# Patient Record
Sex: Female | Born: 1995 | Race: Black or African American | Hispanic: No | Marital: Single | Smoking: Former smoker
Health system: Southern US, Community
[De-identification: ages and names within clinical notes are randomized; demographics above are authoritative.]

## PROBLEM LIST (undated history)

## (undated) ENCOUNTER — Inpatient Hospital Stay: Payer: Self-pay

## (undated) DIAGNOSIS — O99019 Anemia complicating pregnancy, unspecified trimester: Secondary | ICD-10-CM

## (undated) DIAGNOSIS — K8020 Calculus of gallbladder without cholecystitis without obstruction: Secondary | ICD-10-CM

## (undated) DIAGNOSIS — Z8669 Personal history of other diseases of the nervous system and sense organs: Secondary | ICD-10-CM

## (undated) DIAGNOSIS — F99 Mental disorder, not otherwise specified: Secondary | ICD-10-CM

## (undated) DIAGNOSIS — F1210 Cannabis abuse, uncomplicated: Secondary | ICD-10-CM

## (undated) HISTORY — DX: Personal history of other diseases of the nervous system and sense organs: Z86.69

## (undated) HISTORY — DX: Mental disorder, not otherwise specified: F99

## (undated) HISTORY — PX: CHOLECYSTECTOMY: SHX55

---
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
## 2014-01-24 ENCOUNTER — Emergency Department: Payer: Self-pay | Admitting: Internal Medicine

## 2014-01-24 LAB — URINALYSIS, COMPLETE
BILIRUBIN, UR: NEGATIVE
Bacteria: NONE SEEN
Blood: NEGATIVE
Glucose,UR: NEGATIVE mg/dL (ref 0–75)
Leukocyte Esterase: NEGATIVE
Nitrite: NEGATIVE
PH: 5 (ref 4.5–8.0)
Protein: NEGATIVE
RBC, UR: NONE SEEN /HPF (ref 0–5)
Specific Gravity: 1.03 (ref 1.003–1.030)
WBC UR: NONE SEEN /HPF (ref 0–5)

## 2014-01-24 LAB — CBC
HCT: 37.4 % (ref 35.0–47.0)
HGB: 12.2 g/dL (ref 12.0–16.0)
MCH: 29.3 pg (ref 26.0–34.0)
MCHC: 32.7 g/dL (ref 32.0–36.0)
MCV: 90 fL (ref 80–100)
Platelet: 249 10*3/uL (ref 150–440)
RBC: 4.17 10*6/uL (ref 3.80–5.20)
RDW: 13.9 % (ref 11.5–14.5)
WBC: 5.9 10*3/uL (ref 3.6–11.0)

## 2014-01-24 LAB — HCG, QUANTITATIVE, PREGNANCY: Beta Hcg, Quant.: 9979 m[IU]/mL — ABNORMAL HIGH

## 2014-05-02 ENCOUNTER — Emergency Department: Payer: Self-pay | Admitting: Emergency Medicine

## 2014-07-15 ENCOUNTER — Emergency Department: Payer: Self-pay | Admitting: Emergency Medicine

## 2014-07-15 LAB — CBC WITH DIFFERENTIAL/PLATELET
Bands: 1 %
EOS PCT: 2 %
HCT: 29.6 % — AB (ref 35.0–47.0)
HGB: 9.3 g/dL — ABNORMAL LOW (ref 12.0–16.0)
LYMPHS PCT: 22 %
MCH: 29.1 pg (ref 26.0–34.0)
MCHC: 31.5 g/dL — AB (ref 32.0–36.0)
MCV: 92 fL (ref 80–100)
Metamyelocyte: 4 %
Monocytes: 9 %
Platelet: 169 10*3/uL (ref 150–440)
RBC: 3.2 10*6/uL — AB (ref 3.80–5.20)
RDW: 15 % — AB (ref 11.5–14.5)
SEGMENTED NEUTROPHILS: 62 %
WBC: 7.7 10*3/uL (ref 3.6–11.0)

## 2014-07-15 LAB — COMPREHENSIVE METABOLIC PANEL
ALBUMIN: 2.8 g/dL — AB (ref 3.8–5.6)
ALK PHOS: 108 U/L
ALT: 20 U/L
ANION GAP: 9 (ref 7–16)
AST: 18 U/L (ref 0–26)
BUN: 7 mg/dL (ref 7–18)
Bilirubin,Total: 0.3 mg/dL (ref 0.2–1.0)
CALCIUM: 8.1 mg/dL — AB (ref 9.0–10.7)
CO2: 24 mmol/L (ref 21–32)
CREATININE: 0.45 mg/dL — AB (ref 0.60–1.30)
Chloride: 107 mmol/L (ref 98–107)
EGFR (African American): >60
EGFR (Non-African Amer.): >60
GLUCOSE: 78 mg/dL (ref 65–99)
OSMOLALITY: 276 (ref 275–301)
POTASSIUM: 3.5 mmol/L (ref 3.5–5.1)
Sodium: 140 mmol/L (ref 136–145)
TOTAL PROTEIN: 6.3 g/dL — AB (ref 6.4–8.6)

## 2014-07-15 LAB — URINALYSIS, COMPLETE
BACTERIA: NONE SEEN
BILIRUBIN, UR: NEGATIVE
BLOOD: NEGATIVE
GLUCOSE, UR: NEGATIVE mg/dL (ref 0–75)
Ketone: NEGATIVE
Leukocyte Esterase: NEGATIVE
NITRITE: NEGATIVE
PH: 6 (ref 4.5–8.0)
PROTEIN: NEGATIVE
SPECIFIC GRAVITY: 1.011 (ref 1.003–1.030)
Squamous Epithelial: 9
WBC UR: 7 /HPF (ref 0–5)

## 2014-09-17 ENCOUNTER — Inpatient Hospital Stay: Payer: Self-pay | Admitting: Obstetrics & Gynecology

## 2014-09-17 LAB — PIH PROFILE
ANION GAP: 4 — AB (ref 7–16)
BUN: 5 mg/dL — AB (ref 7–18)
CALCIUM: 8.6 mg/dL — AB (ref 9.0–10.7)
CHLORIDE: 106 mmol/L (ref 98–107)
CO2: 28 mmol/L (ref 21–32)
Creatinine: 0.53 mg/dL — ABNORMAL LOW (ref 0.60–1.30)
GLUCOSE: 97 mg/dL (ref 65–99)
HCT: 32.3 % — AB (ref 35.0–47.0)
HGB: 10.2 g/dL — AB (ref 12.0–16.0)
MCH: 28.6 pg (ref 26.0–34.0)
MCHC: 31.7 g/dL — ABNORMAL LOW (ref 32.0–36.0)
MCV: 90 fL (ref 80–100)
OSMOLALITY: 273 (ref 275–301)
Platelet: 154 10*3/uL (ref 150–440)
Potassium: 3.8 mmol/L (ref 3.5–5.1)
RBC: 3.58 10*6/uL — AB (ref 3.80–5.20)
RDW: 16.7 % — AB (ref 11.5–14.5)
SGOT(AST): 18 U/L (ref 0–26)
SODIUM: 138 mmol/L (ref 136–145)
Uric Acid: 3.5 mg/dL (ref 3.0–5.8)
WBC: 6.7 10*3/uL (ref 3.6–11.0)

## 2014-09-17 LAB — DRUG SCREEN, URINE
Amphetamines, Ur Screen: NEGATIVE (ref ?–1000)
Barbiturates, Ur Screen: NEGATIVE (ref ?–200)
Benzodiazepine, Ur Scrn: NEGATIVE (ref ?–200)
CANNABINOID 50 NG, UR ~~LOC~~: NEGATIVE (ref ?–50)
COCAINE METABOLITE, UR ~~LOC~~: NEGATIVE (ref ?–300)
MDMA (Ecstasy)Ur Screen: NEGATIVE (ref ?–500)
METHADONE, UR SCREEN: NEGATIVE (ref ?–300)
OPIATE, UR SCREEN: NEGATIVE (ref ?–300)
PHENCYCLIDINE (PCP) UR S: NEGATIVE (ref ?–25)
TRICYCLIC, UR SCREEN: NEGATIVE (ref ?–1000)

## 2014-09-17 LAB — CBC WITH DIFFERENTIAL/PLATELET
Eosinophil: 1 %
HCT: 31.9 % — ABNORMAL LOW (ref 35.0–47.0)
HGB: 10.3 g/dL — AB (ref 12.0–16.0)
LYMPHS PCT: 28 %
MCH: 28.7 pg (ref 26.0–34.0)
MCHC: 32.2 g/dL (ref 32.0–36.0)
MCV: 89 fL (ref 80–100)
MONOS PCT: 7 %
MYELOCYTE: 2 %
NRBC/100 WBC: 1 /
PLATELETS: 169 10*3/uL (ref 150–440)
RBC: 3.57 10*6/uL — ABNORMAL LOW (ref 3.80–5.20)
RDW: 17.1 % — AB (ref 11.5–14.5)
SEGMENTED NEUTROPHILS: 61 %
VARIANT LYMPHOCYTE - H1-RLYMPH: 1 %
WBC: 7.3 10*3/uL (ref 3.6–11.0)

## 2014-09-17 LAB — PROTEIN / CREATININE RATIO, URINE
Creatinine, Urine: 41.4 mg/dL (ref 30.0–125.0)
PROTEIN, RANDOM URINE: 11 mg/dL (ref 0–12)
Protein/Creat. Ratio: 266 mg/gCREAT — ABNORMAL HIGH (ref 0–200)

## 2014-09-18 LAB — GC/CHLAMYDIA PROBE AMP

## 2014-09-19 LAB — HEMATOCRIT: HCT: 25.3 % — AB (ref 35.0–47.0)

## 2014-10-12 ENCOUNTER — Emergency Department: Payer: Self-pay | Admitting: Emergency Medicine

## 2014-10-12 LAB — COMPREHENSIVE METABOLIC PANEL
ALK PHOS: 162 U/L — AB
ANION GAP: 4 — AB (ref 7–16)
Albumin: 4 g/dL (ref 3.8–5.6)
BILIRUBIN TOTAL: 0.4 mg/dL (ref 0.2–1.0)
BUN: 11 mg/dL (ref 7–18)
CALCIUM: 9.1 mg/dL (ref 9.0–10.7)
CHLORIDE: 106 mmol/L (ref 98–107)
CO2: 29 mmol/L (ref 21–32)
Creatinine: 0.66 mg/dL (ref 0.60–1.30)
Glucose: 93 mg/dL (ref 65–99)
OSMOLALITY: 277 (ref 275–301)
Potassium: 4.2 mmol/L (ref 3.5–5.1)
SGOT(AST): 156 U/L — ABNORMAL HIGH (ref 0–26)
SGPT (ALT): 103 U/L — ABNORMAL HIGH
SODIUM: 139 mmol/L (ref 136–145)
Total Protein: 7.3 g/dL (ref 6.4–8.6)

## 2014-10-12 LAB — URINALYSIS, COMPLETE
BILIRUBIN, UR: NEGATIVE
Bacteria: NONE SEEN
Blood: NEGATIVE
Glucose,UR: NEGATIVE mg/dL (ref 0–75)
KETONE: NEGATIVE
Leukocyte Esterase: NEGATIVE
Nitrite: NEGATIVE
PROTEIN: NEGATIVE
Ph: 8 (ref 4.5–8.0)
SPECIFIC GRAVITY: 1.015 (ref 1.003–1.030)

## 2014-10-12 LAB — CBC WITH DIFFERENTIAL/PLATELET
BASOS ABS: 0 10*3/uL (ref 0.0–0.1)
Basophil %: 0.1 %
EOS ABS: 0 10*3/uL (ref 0.0–0.7)
Eosinophil %: 0.5 %
HCT: 37.1 % (ref 35.0–47.0)
HGB: 11.7 g/dL — ABNORMAL LOW (ref 12.0–16.0)
LYMPHS PCT: 11.5 %
Lymphocyte #: 1 10*3/uL (ref 1.0–3.6)
MCH: 27.4 pg (ref 26.0–34.0)
MCHC: 31.4 g/dL — AB (ref 32.0–36.0)
MCV: 87 fL (ref 80–100)
MONOS PCT: 5 %
Monocyte #: 0.4 x10 3/mm (ref 0.2–0.9)
NEUTROS ABS: 7 10*3/uL — AB (ref 1.4–6.5)
NEUTROS PCT: 82.9 %
PLATELETS: 244 10*3/uL (ref 150–440)
RBC: 4.26 10*6/uL (ref 3.80–5.20)
RDW: 16.4 % — AB (ref 11.5–14.5)
WBC: 8.5 10*3/uL (ref 3.6–11.0)

## 2014-10-12 LAB — LIPASE, BLOOD: LIPASE: 163 U/L (ref 73–393)

## 2014-10-29 HISTORY — PX: CHOLECYSTECTOMY, LAPAROSCOPIC: SHX56

## 2014-11-07 ENCOUNTER — Ambulatory Visit: Payer: Self-pay | Admitting: Surgery

## 2014-11-14 ENCOUNTER — Ambulatory Visit: Payer: Self-pay | Admitting: Surgery

## 2015-01-21 LAB — SURGICAL PATHOLOGY

## 2015-01-27 NOTE — Op Note (Signed)
PATIENT NAMMarland [name]:  Rosemary HolmsGRAVES, [name] MR#:  [id number] DATE OF BIRTH:  07/23/1996  DATE OF PROCEDURE:  11/14/2014  PREOPERATIVE DIAGNOSIS: Symptomatic cholelithiasis.   POSTOPERATIVE DIAGNOSIS: Symptomatic cholelithiasis.   PROCEDURE: Laparoscopic cholecystectomy with cholangiography.   SURGEON:  Dionne Miloichard Adina Puzzo, M.D.   ANESTHESIA: General with endotracheal tube.   INDICATIONS: This is a patient with a history of gallstones and recurrent episodic biliary colic attacks who also had some liver function tests elevation in the immediate postpartum period. She is here for elective laparoscopic cholecystectomy with cholangiography. Her LFTs have returned to normal.   Preoperatively, we discussed rationale for surgery, the options of observation, risk of bleeding, infection, recurrence of symptoms, failure to resolve her symptoms, retained common bile duct stone, open procedure, bile duct damage, bile duct leak, any of which could require further surgery and/or ERCP, stent, and papillotomy. This was reviewed for her in the preop holding area. She understood and agreed to proceed.   FINDINGS: Small gallstones, what appeared to be a dilated common bile duct with a very small cystic duct.   Cholangiography demonstrated good flow in the duodenum. No intraluminal filling defects. Proximal ducts were well identified. The cystic duct had been cannulated.   DESCRIPTION OF PROCEDURE: The patient was induced to general anesthesia, given IV antibiotics. VTE prophylaxis was in place. She was prepped and draped in a sterile fashion. Marcaine was infiltrated in skin and subcutaneous tissues around the infraumbilical area. An incision was made avoiding the area of her supraumbilical piercing. A Veress needle was placed. Pneumoperitoneum was obtained. A 5 mm trocar port was placed. The abdominal cavity was explored, and under direct vision, a 10 mm epigastric port and two lateral 5 mm ports were placed. The gallbladder was  placed on tension. The peritoneum over the infundibulum was incised bluntly. The dilated common bile duct could be easily visualized in this area; therefore, cholangiography was performed as follows:  The cystic duct was dissected out at the infundibular junction was clipped and incised, and through a separate incision, an Angiocath cholangiogram catheter was placed. C-arm fluoroscopic cholangiography demonstrated the above. The cholangiogram catheter was removed. The cystic duct was doubly clipped and then divided. The cystic artery was doubly clipped and divided and the gallbladder was taken from gallbladder fossa with electrocautery and passed out through the epigastric port site with the aid of an Endo Catch bag. The area was checked for hemostasis, irrigated with copious amounts of normal saline. Hemostasis again checked and found to be adequate. The camera was placed in the epigastric site to [facility](Dictation Anomaly) <<view>> back to the periumbilical site. There was no sign of bowel injury or adhesions. Therefore, pneumoperitoneum was released. All ports were removed. Fascial edges at the epigastric site were approximated with figure-of-eight 0 Vicryl, 4-0 subcuticular Monocryl was used on all skin edges. Steri-Strips, Mastisol and sterile dressings were placed. The patient tolerated the procedure well. There were no complications. She was taken to the recovery room in stable condition to be discharged to the care of her family. She will follow up in 10 days.     ____________________________ Adah Salvageichard E. Excell Seltzerooper, MD rec:at D: 11/14/2014 13:15:56 ET T: 11/14/2014 21:22:00 ET JOB#: [id number]  cc: Adah Salvageichard E. Excell Seltzerooper, MD, <Dictator> Lattie HawICHARD E Nickolaus Bordelon MD ELECTRONICALLY SIGNED 11/16/2014 20:48

## 2015-01-27 NOTE — H&P (Signed)
PATIENT NAMRosemary [name]:  [name], [name] MR#:  [id number] DATE OF BIRTH:  Aug 23, 1996  DATE OF ADMISSION:  11/14/2014  CHIEF COMPLAINT: Right upper quadrant pain.   HISTORY OF PRESENT ILLNESS: This is a patient with recurrent and episodic right upper quadrant pain. She is 19 years old. She has had a work-up showing gallstones. She is here for elective laparoscopic cholecystectomy for control of her symptoms.   PAST MEDICAL HISTORY: Gallstones, depression and hyperactivity disorder.   PAST SURGICAL HISTORY: None.   MEDICATIONS: None.   ALLERGIES: None.   FAMILY HISTORY: Noncontributory.   SOCIAL HISTORY: The patient does not smoke or drink or use illicit drugs.   REVIEW OF SYSTEMS: A complete system review is documented in the office chart and negative with the exception of that mentioned in the HPI.  PHYSICAL EXAMINATION: GENERAL: Healthy-appearing female patient, 145 pounds.  HEENT: No scleral icterus.  NECK: No palpable neck nodes.  CHEST: Clear to auscultation.  CARDIAC: Regular rate and rhythm.  ABDOMEN: Soft, nontender.  EXTREMITIES: Without edema.  NEUROLOGIC: Grossly intact.  INTEGUMENT: No jaundice.   DIAGNOSTIC DATA: An ultrasound shows stones.   Laboratory values demonstrate a H and H of 11 and 35, a white blood cell count of 5.2. Electrolytes are within normal limits. Alkaline phosphatase is 141. AST and ALT are normal, but had been elevated on prior admission to the Emergency Room, 103 and 156, respectively.   ASSESSMENT AND PLAN: This is a patient with known gallstones. She had elevated liver function tests in January, but her liver function tests are normal on repeat following a recent office visit. She is here for elective laparoscopic cholecystectomy. The rationale for offering surgery has been discussed. The risks of bleeding, infection, recurrence of symptoms, failure to resolve her symptoms, open procedure, bile duct damage, bile duct leak, and retained common bile duct  stone any of which could require further surgery and/or ERCP, stent, and papillotomy have all been reviewed. She understood and agreed to proceed.  ____________________________ Adah Salvageichard E. Excell Seltzerooper, MD rec:sb D: 11/13/2014 08:58:15 ET T: 11/13/2014 09:31:45 ET JOB#: [id number]  cc: Adah Salvageichard E. Excell Seltzerooper, MD, <Dictator> Lattie HawICHARD E Marquize Seib MD ELECTRONICALLY SIGNED 11/13/2014 10:06

## 2015-02-05 NOTE — H&P (Signed)
L&D Evaluation:  History:  HPI 19 year old g1 P0 with EDC=09/20/2014 by LMP=12/14/2013 and a 9wk ultrasound presents at 30 4/7 weeks with c/o regular ctxs since 0200 this AM. She has also c/o feeling wet since arrival to L&D and there is a question of SROM. Had a few BP elevations also. Denies headache, visual changes, RUQ pain etc. Baby has been active. No bleeding. [facility], an early +UDS for MJ (pt reports quitting MJ and had a neg UDS in second trimester), and anemia with hmg=10gm/dl at 28 weeks. Had TDAP and flu vaccines 10/29. Labs: APOS/RI/VI/GBS negative. Plans on Breast feeding. [facility]   Presents with contractions   Patient's Medical History Anemia   Patient's Surgical History none   Medications stopped vitamins and FE   Allergies NKDA   Social History drugs  MJ in early pregnancy.   Family History Non-Contributory   ROS:  ROS see HPI   Exam:  Vital Signs 136/84, 141/90, 124/69, 128/77   General no apparent distress   Mental Status clear   Chest clear   Heart normal sinus rhythm, no murmur/gallop/rubs   Abdomen gravid, tender with contractions   Estimated Fetal Weight Average for gestational age   Fetal Position cephalic   Reflexes 1+   Pelvic no external lesions, SSE: fern, Nitrazine and pooling neg. +hyphae on fern slide. CX:2/80%/-1 (no change in 1-2 hrs)   Mebranes Intact   FHT normal rate with no decels, 130 with accels to 150   FHT Description Cat1   Ucx q3-708min   Skin dry   Impression:  Impression IUP at 39.4 weeks in early labor. Mild BP elevations. R/O  Preeclampsia. No evidence of SROM.   Plan:  Plan EFM/NST, monitor contractions and for cervical change, PIH labs including P/C ratio-if WNL can ambulate/eat light breakfast..   Electronic Signatures: Trinna BalloonGutierrez, Undray Allman L (CNM)  (Signed 21-Dec-15 23:05)  Authored: L&D Evaluation   Last Updated: 21-Dec-15 23:05 by Trinna BalloonGutierrez,  Dillin Lofgren L (CNM)

## 2015-08-29 LAB — OB RESULTS CONSOLE ABO/RH: RH Type: POSITIVE

## 2015-08-29 LAB — OB RESULTS CONSOLE ANTIBODY SCREEN: ANTIBODY SCREEN: NEGATIVE

## 2015-08-29 LAB — OB RESULTS CONSOLE HIV ANTIBODY (ROUTINE TESTING): HIV: NONREACTIVE

## 2015-08-29 LAB — OB RESULTS CONSOLE VARICELLA ZOSTER ANTIBODY, IGG: Varicella: IMMUNE

## 2015-08-29 LAB — OB RESULTS CONSOLE RUBELLA ANTIBODY, IGM: Rubella: IMMUNE

## 2015-08-29 LAB — OB RESULTS CONSOLE HEPATITIS B SURFACE ANTIGEN: Hepatitis B Surface Ag: NEGATIVE

## 2015-08-29 LAB — OB RESULTS CONSOLE RPR: RPR: NONREACTIVE

## 2015-09-12 ENCOUNTER — Other Ambulatory Visit: Payer: Self-pay | Admitting: Obstetrics and Gynecology

## 2015-09-12 DIAGNOSIS — Q792 Exomphalos: Secondary | ICD-10-CM

## 2015-09-29 NOTE — L&D Delivery Note (Signed)
Obstetrical Delivery Note   Date of Delivery:   03/29/2016 Primary OB:   [facility] Gestational Age/EDD: 223w4d (Dated by LMP=10wk u/s) Antepartum complications: concerns for small HC, Marijuana use, anemia  Delivered By:   Farrel ConnersGUTIERREZ, Jamien Casanova, CNM  Delivery Type:   spontaneous vaginal delivery  Procedure Details:   Admitted to L&D in early labor and progressed slowly into active labor. Contractions spaced out after fentanyl and epidural and was just started on Pitocin when she felt urge to push. Progressed rapidly from anterior lip to completely dilated and which time pushing was begun. Had a short second stage delivering a viable and vigorous female infant on ROA over intact perineum. Baby placed on mother's abdomen and after delayed cord clamping, the FOB cut the cord. Baby then placed skin to skin with mother. Anesthesia:    epidural Intrapartum complications: None GBS:    negative Laceration:    none Episiotomy:    none Placenta:    Via active 3rd stage. To pathology: no Estimated Blood Loss:  350 ml  Baby:    Liveborn female, Apgars 8/9, weight pending Kristy [name]    Kristy [name], CNM

## 2015-12-17 ENCOUNTER — Other Ambulatory Visit: Payer: Self-pay | Admitting: Certified Nurse Midwife

## 2015-12-17 DIAGNOSIS — Q02 Microcephaly: Secondary | ICD-10-CM

## 2015-12-23 ENCOUNTER — Ambulatory Visit
Admission: RE | Admit: 2015-12-23 | Discharge: 2015-12-23 | Disposition: A | Discharge status: Home / Self Care | Payer: Medicaid Other | Source: Ambulatory Visit | Attending: Obstetrics & Gynecology | Admitting: Obstetrics & Gynecology

## 2015-12-23 VITALS — BP 101/77 | HR 89 | Temp 97.8°F | Resp 18 | Ht 62.0 in | Wt 156.0 lb

## 2015-12-23 DIAGNOSIS — Z36 Encounter for antenatal screening of mother: Secondary | ICD-10-CM | POA: Diagnosis present

## 2015-12-23 DIAGNOSIS — Q02 Microcephaly: Secondary | ICD-10-CM | POA: Diagnosis not present

## 2015-12-23 DIAGNOSIS — Z3A25 25 weeks gestation of pregnancy: Secondary | ICD-10-CM | POA: Diagnosis not present

## 2015-12-23 DIAGNOSIS — O365920 Maternal care for other known or suspected poor fetal growth, second trimester, not applicable or unspecified: Secondary | ICD-10-CM | POA: Diagnosis not present

## 2016-01-20 ENCOUNTER — Ambulatory Visit
Admission: RE | Admit: 2016-01-20 | Discharge: 2016-01-20 | Disposition: A | Discharge status: Home / Self Care | Payer: Medicaid Other | Source: Ambulatory Visit | Attending: Obstetrics and Gynecology | Admitting: Obstetrics and Gynecology

## 2016-01-20 ENCOUNTER — Other Ambulatory Visit: Payer: Self-pay

## 2016-01-20 DIAGNOSIS — Z36 Encounter for antenatal screening of mother: Secondary | ICD-10-CM | POA: Diagnosis present

## 2016-01-20 DIAGNOSIS — O359XX1 Maternal care for (suspected) fetal abnormality and damage, unspecified, fetus 1: Secondary | ICD-10-CM | POA: Insufficient documentation

## 2016-01-20 DIAGNOSIS — Z3A29 29 weeks gestation of pregnancy: Secondary | ICD-10-CM | POA: Diagnosis not present

## 2016-02-19 ENCOUNTER — Encounter: Payer: Self-pay | Admitting: *Deleted

## 2016-02-19 ENCOUNTER — Observation Stay
Admission: EM | Admit: 2016-02-19 | Discharge: 2016-02-19 | Disposition: A | Discharge status: Home / Self Care | Payer: Medicaid Other | Attending: Obstetrics & Gynecology | Admitting: Obstetrics & Gynecology

## 2016-02-19 DIAGNOSIS — Z3A34 34 weeks gestation of pregnancy: Secondary | ICD-10-CM | POA: Diagnosis not present

## 2016-02-19 DIAGNOSIS — M549 Dorsalgia, unspecified: Secondary | ICD-10-CM | POA: Diagnosis present

## 2016-02-19 DIAGNOSIS — O9989 Other specified diseases and conditions complicating pregnancy, childbirth and the puerperium: Secondary | ICD-10-CM | POA: Diagnosis present

## 2016-02-19 DIAGNOSIS — M545 Low back pain: Secondary | ICD-10-CM | POA: Diagnosis not present

## 2016-02-19 DIAGNOSIS — O99891 Other specified diseases and conditions complicating pregnancy: Secondary | ICD-10-CM | POA: Diagnosis present

## 2016-02-19 LAB — URINALYSIS COMPLETE WITH MICROSCOPIC (ARMC ONLY)
Bilirubin Urine: NEGATIVE
Glucose, UA: NEGATIVE mg/dL
Hgb urine dipstick: NEGATIVE
KETONES UR: NEGATIVE mg/dL
Nitrite: NEGATIVE
PH: 7 (ref 5.0–8.0)
PROTEIN: NEGATIVE mg/dL
Specific Gravity, Urine: 1.011 (ref 1.005–1.030)

## 2016-02-19 MED ORDER — ACETAMINOPHEN 325 MG PO TABS: 650.0000 mg | ORAL_TABLET | ORAL | Status: DC | PRN

## 2016-02-19 MED ORDER — ONDANSETRON HCL 4 MG/2ML IJ SOLN: 4.0000 mg | Freq: Four times a day (QID) | INTRAMUSCULAR | Status: DC | PRN

## 2016-02-19 NOTE — OB Triage Note (Signed)
Presents with complaint of lower back pain for 2 weeks but got worse today while at work and started feeling some "butt pain"  Like a cramp in her butt cheek. Denies any bleeding or leaking of fluid.

## 2016-02-19 NOTE — Final Progress Note (Signed)
Physician Final Progress Note  Patient ID: Kristy [name] MRN: [id number] DOB/AGE: Jan 29, 1996 20 y.o.  Admit date: 02/19/2016 Admitting provider: Nadara Mustardobert P Kayela Humphres, MD Discharge date: 02/19/2016   Admission Diagnoses: Low back pain- Left sided  Discharge Diagnoses:  Active Problems:   Back pain affecting pregnancy  Consults: None  Significant Findings/ Diagnostic Studies: labs: UA neg  Procedures: A NST procedure was performed with FHR monitoring and a normal baseline established, appropriate time of 20-40 minutes of evaluation, and accels >2 seen w 15x15 characteristics.  Results show a REACTIVE NST.   Discharge Condition: good  Disposition: Pt seen and examined, not in labor and FWB reassuring.  No signs of UTI, kidney stone, or other onerous causes of low back pain.  Pain mostly in hip area, likely musculoskeletal. Pt to follow up for routine PNC.  Diet: Regular diet  Discharge Activity: Activity as tolerated     Medication List    Notice    Tylenol along with heat and rest       Total time spent taking care of this patient: 15 minutes  Signed: Letitia Libraobert Paul Joann Jorge 02/19/2016, 1:34 PM

## 2016-02-23 ENCOUNTER — Encounter: Payer: Self-pay | Admitting: Emergency Medicine

## 2016-02-23 ENCOUNTER — Emergency Department
Admission: EM | Admit: 2016-02-23 | Discharge: 2016-02-23 | Disposition: A | Discharge status: Home / Self Care | Payer: Medicaid Other | Attending: Emergency Medicine | Admitting: Emergency Medicine

## 2016-02-23 DIAGNOSIS — Z3A34 34 weeks gestation of pregnancy: Secondary | ICD-10-CM | POA: Diagnosis not present

## 2016-02-23 DIAGNOSIS — R55 Syncope and collapse: Secondary | ICD-10-CM | POA: Diagnosis not present

## 2016-02-23 DIAGNOSIS — O26893 Other specified pregnancy related conditions, third trimester: Secondary | ICD-10-CM | POA: Diagnosis not present

## 2016-02-23 DIAGNOSIS — R42 Dizziness and giddiness: Secondary | ICD-10-CM | POA: Diagnosis present

## 2016-02-23 LAB — URINALYSIS COMPLETE WITH MICROSCOPIC (ARMC ONLY)
BILIRUBIN URINE: NEGATIVE
GLUCOSE, UA: NEGATIVE mg/dL
HGB URINE DIPSTICK: NEGATIVE
KETONES UR: NEGATIVE mg/dL
Nitrite: NEGATIVE
Protein, ur: 100 mg/dL — AB
Specific Gravity, Urine: 1.023 (ref 1.005–1.030)
pH: 6 (ref 5.0–8.0)

## 2016-02-23 LAB — BASIC METABOLIC PANEL
Anion gap: 9 (ref 5–15)
BUN: 7 mg/dL (ref 6–20)
CALCIUM: 8.7 mg/dL — AB (ref 8.9–10.3)
CHLORIDE: 106 mmol/L (ref 101–111)
CO2: 20 mmol/L — ABNORMAL LOW (ref 22–32)
CREATININE: 0.47 mg/dL (ref 0.44–1.00)
GFR calc non Af Amer: >60 mL/min (ref >60)
Glucose, Bld: 144 mg/dL — ABNORMAL HIGH (ref 65–99)
Potassium: 3.5 mmol/L (ref 3.5–5.1)
SODIUM: 135 mmol/L (ref 135–145)

## 2016-02-23 LAB — CBC
HCT: 27.2 % — ABNORMAL LOW (ref 35.0–47.0)
Hemoglobin: 8.7 g/dL — ABNORMAL LOW (ref 12.0–16.0)
MCH: 27.9 pg (ref 26.0–34.0)
MCHC: 32 g/dL (ref 32.0–36.0)
MCV: 87.1 fL (ref 80.0–100.0)
PLATELETS: 183 10*3/uL (ref 150–440)
RBC: 3.12 MIL/uL — AB (ref 3.80–5.20)
RDW: 16.7 % — AB (ref 11.5–14.5)
WBC: 8.5 10*3/uL (ref 3.6–11.0)

## 2016-02-23 NOTE — ED Provider Notes (Signed)
[facility] Emergency Department Provider Note  Time seen: 3:27 PM  I have reviewed the triage vital signs and the nursing notes.   HISTORY  Chief Complaint Loss of Consciousness    HPI Kristy [name] is a 20 y.o. female currently [redacted] weeks pregnant who presents to the emergency Department after syncopal episode. According to the patient she was at work, when she stood up she got very lightheaded/dizzy, got sweaty and felt nauseated, neck thinks she remembers waking up on the floor. Patient denies history of syncopal in the past. Patient denies any chest pain at any point. Patient states an uncomplicated pregnancy. Feels normal currently.     Past Medical History  Diagnosis Date  . Medical history non-contributory     Patient Active Problem List   Diagnosis Date Noted  . Back pain affecting pregnancy 02/19/2016  . Poor fetal growth affecting management of mother in second trimester 12/23/2015    Past Surgical History  Procedure Laterality Date  . Cholecystectomy, laparoscopic  Feb 2016    No current outpatient prescriptions on file.  Allergies Review of patient's allergies indicates no known allergies.  No family history on file.  Social History Social History  Substance Use Topics  . Smoking status: Never Smoker   . Smokeless tobacco: Never Used  . Alcohol Use: No    Review of Systems Constitutional: Negative for fever. Cardiovascular: Negative for chest pain. Respiratory: Negative for shortness of breath. Gastrointestinal: Negative for abdominal pain. Positive for nausea which has now resolved. Negative for vomiting. Neurological: Negative for headache 10-point ROS otherwise negative.  ____________________________________________   PHYSICAL EXAM:  VITAL SIGNS: ED Triage Vitals  Enc Vitals Group     BP 02/23/16 1139 115/65 mmHg     Pulse --      Resp 02/23/16 1139 24     Temp 02/23/16 1139 98.4 F (36.9 C)     Temp Source  02/23/16 1139 Oral     SpO2 02/23/16 1139 100 %     Weight 02/23/16 1139 157 lb (71.215 kg)     Height 02/23/16 1139  (1.549 m)     Head Cir --      Peak Flow --      Pain Score 02/23/16 1141 0     Pain Loc --      Pain Edu? --      Excl. in GC? --     Constitutional: Alert and oriented. Well appearing and in no distress. Eyes: Normal exam ENT   Head: Normocephalic and atraumatic.   Mouth/Throat: Mucous membranes are moist. Cardiovascular: Normal rate, regular rhythm. No murmur Respiratory: Normal respiratory effort without tachypnea nor retractions. Breath sounds are clear  Gastrointestinal: Gravid uterus, nontender. Musculoskeletal: Nontender with normal range of motion in all extremities. Neurologic:  Normal speech and language. No gross focal neurologic deficits  Skin:  Skin is warm, dry and intact.  Psychiatric: Mood and affect are normal.   ____________________________________________    EKG  EKG reviewed and interpreted by myself shows normal sinus rhythm at 99 bpm, narrow QRS, normal axis, normal intervals, no ST changes. Normal EKG.  ____________________________________________     INITIAL IMPRESSION / ASSESSMENT AND PLAN / ED COURSE  Pertinent labs & imaging results that were available during my care of the patient were reviewed by me and considered in my medical decision making (see chart for details).  Patient presents with a syncopal episode while at work today. Patient is [redacted] weeks pregnant.  Patient looks very well, denies any symptoms currently. Patient's labs are at her baseline. Urinalysis is equivocal. UTI, patient denies any UTI symptoms. We will send a urine culture. I have instructed the patient to hydrate. She denies any abdominal pain, discharge or bleeding. Ultrasound performed by myself shows a heart rate of 138 bpm, good fetal movement with a good amount of amniotic fluid. We will discharge the patient [facility].  ____________________________________________   FINAL CLINICAL IMPRESSION(S) / ED DIAGNOSES  Syncopal   Minna AntisKevin Aeva Posey, MD 02/23/16 1530

## 2016-02-23 NOTE — ED Notes (Signed)
Patient presents to the ED with syncopal episode at 10:40am this morning.  Patient is [redacted] weeks pregnant at this time.  Patient states she had diaphoresis and felt anxious prior to syncopal episode.  Patient is complaining of a headache but denies feeling dizzy or lightheaded at this time.  During triage patient's heart rate has mostly been around 100bpm for for about 30 seconds during triage, the pulse read 180bpm, unable to get an EKG during this time.  Patient denied feeling poorly during that time.

## 2016-02-23 NOTE — ED Notes (Signed)
MD used ultrasound for fetal heart tones

## 2016-02-23 NOTE — Discharge Instructions (Signed)

## 2016-02-24 LAB — URINE CULTURE

## 2016-03-11 LAB — OB RESULTS CONSOLE GC/CHLAMYDIA
CHLAMYDIA, DNA PROBE: NEGATIVE
Gonorrhea: NEGATIVE

## 2016-03-11 LAB — OB RESULTS CONSOLE GBS: GBS: NEGATIVE

## 2016-03-29 ENCOUNTER — Encounter: Payer: Self-pay | Admitting: *Deleted

## 2016-03-29 ENCOUNTER — Inpatient Hospital Stay: Payer: Medicaid Other | Admitting: Anesthesiology

## 2016-03-29 ENCOUNTER — Inpatient Hospital Stay
Admission: EM | Admit: 2016-03-29 | Discharge: 2016-03-31 | DRG: 775 | Disposition: A | Discharge status: Home / Self Care | Payer: Medicaid Other | Attending: Certified Nurse Midwife | Admitting: Certified Nurse Midwife

## 2016-03-29 DIAGNOSIS — O99324 Drug use complicating childbirth: Secondary | ICD-10-CM | POA: Diagnosis present

## 2016-03-29 DIAGNOSIS — Z9049 Acquired absence of other specified parts of digestive tract: Secondary | ICD-10-CM | POA: Diagnosis not present

## 2016-03-29 DIAGNOSIS — D509 Iron deficiency anemia, unspecified: Secondary | ICD-10-CM | POA: Diagnosis present

## 2016-03-29 DIAGNOSIS — Z3A39 39 weeks gestation of pregnancy: Secondary | ICD-10-CM | POA: Diagnosis not present

## 2016-03-29 DIAGNOSIS — D62 Acute posthemorrhagic anemia: Secondary | ICD-10-CM | POA: Diagnosis present

## 2016-03-29 DIAGNOSIS — O9902 Anemia complicating childbirth: Secondary | ICD-10-CM | POA: Diagnosis present

## 2016-03-29 DIAGNOSIS — O365920 Maternal care for other known or suspected poor fetal growth, second trimester, not applicable or unspecified: Secondary | ICD-10-CM

## 2016-03-29 HISTORY — DX: Anemia complicating pregnancy, unspecified trimester: O99.019

## 2016-03-29 HISTORY — DX: Calculus of gallbladder without cholecystitis without obstruction: K80.20

## 2016-03-29 HISTORY — DX: Cannabis abuse, uncomplicated: F12.10

## 2016-03-29 LAB — URINE DRUG SCREEN, QUALITATIVE (ARMC ONLY)
Amphetamines, Ur Screen: NOT DETECTED
Barbiturates, Ur Screen: NOT DETECTED
Benzodiazepine, Ur Scrn: NOT DETECTED
CANNABINOID 50 NG, UR ~~LOC~~: NOT DETECTED
COCAINE METABOLITE, UR ~~LOC~~: NOT DETECTED
MDMA (ECSTASY) UR SCREEN: NOT DETECTED
METHADONE SCREEN, URINE: NOT DETECTED
Opiate, Ur Screen: NOT DETECTED
Phencyclidine (PCP) Ur S: NOT DETECTED
TRICYCLIC, UR SCREEN: NOT DETECTED

## 2016-03-29 LAB — CHLAMYDIA/NGC RT PCR (ARMC ONLY)
CHLAMYDIA TR: NOT DETECTED
N GONORRHOEAE: NOT DETECTED

## 2016-03-29 LAB — TYPE AND SCREEN
ABO/RH(D): A POS
Antibody Screen: NEGATIVE

## 2016-03-29 LAB — CBC
HCT: 28.4 % — ABNORMAL LOW (ref 35.0–47.0)
Hemoglobin: 9.4 g/dL — ABNORMAL LOW (ref 12.0–16.0)
MCH: 27.4 pg (ref 26.0–34.0)
MCHC: 33.1 g/dL (ref 32.0–36.0)
MCV: 82.7 fL (ref 80.0–100.0)
PLATELETS: 192 10*3/uL (ref 150–440)
RBC: 3.44 MIL/uL — AB (ref 3.80–5.20)
RDW: 17.4 % — ABNORMAL HIGH (ref 11.5–14.5)
WBC: 8.8 10*3/uL (ref 3.6–11.0)

## 2016-03-29 MED ORDER — BENZOCAINE-MENTHOL 20-0.5 % EX AERO
1.0000 "application " | INHALATION_SPRAY | CUTANEOUS | Status: DC | PRN
  Filled 2016-03-29: qty 56

## 2016-03-29 MED ORDER — TERBUTALINE SULFATE 1 MG/ML IJ SOLN: 0.2500 mg | Freq: Once | INTRAMUSCULAR | Status: DC | PRN

## 2016-03-29 MED ORDER — PHENYLEPHRINE 40 MCG/ML (10ML) SYRINGE FOR IV PUSH (FOR BLOOD PRESSURE SUPPORT): 80.0000 ug | PREFILLED_SYRINGE | INTRAVENOUS | Status: DC | PRN

## 2016-03-29 MED ORDER — FERROUS SULFATE 325 (65 FE) MG PO TABS
325.0000 mg | ORAL_TABLET | Freq: Every day | ORAL | Status: DC
  Administered 2016-03-30 – 2016-03-31 (×2): 325 mg via ORAL
  Filled 2016-03-29 (×2): qty 1

## 2016-03-29 MED ORDER — LACTATED RINGERS IV SOLN
INTRAVENOUS | Status: DC
  Administered 2016-03-29 (×2): via INTRAVENOUS

## 2016-03-29 MED ORDER — PHENYLEPHRINE 40 MCG/ML (10ML) SYRINGE FOR IV PUSH (FOR BLOOD PRESSURE SUPPORT)
80.0000 ug | PREFILLED_SYRINGE | INTRAVENOUS | Status: DC | PRN
Start: 2016-03-29 — End: 2016-03-29

## 2016-03-29 MED ORDER — LACTATED RINGERS IV SOLN: 500.0000 mL | Freq: Once | INTRAVENOUS | Status: DC

## 2016-03-29 MED ORDER — WITCH HAZEL-GLYCERIN EX PADS: 1.0000 "application " | MEDICATED_PAD | CUTANEOUS | Status: DC | PRN

## 2016-03-29 MED ORDER — EPHEDRINE 5 MG/ML INJ: 10.0000 mg | INTRAVENOUS | Status: DC | PRN

## 2016-03-29 MED ORDER — LIDOCAINE-EPINEPHRINE (PF) 1.5 %-1:200000 IJ SOLN
INTRAMUSCULAR | Status: DC | PRN
  Administered 2016-03-29: 3 mL via PERINEURAL

## 2016-03-29 MED ORDER — PRENATAL MULTIVITAMIN CH
1.0000 | ORAL_TABLET | Freq: Every day | ORAL | Status: DC
  Administered 2016-03-30: 1 via ORAL
  Filled 2016-03-29: qty 1

## 2016-03-29 MED ORDER — ONDANSETRON HCL 4 MG PO TABS: 4.0000 mg | ORAL_TABLET | ORAL | Status: DC | PRN

## 2016-03-29 MED ORDER — FENTANYL 2.5 MCG/ML W/ROPIVACAINE 0.2% IN NS 100 ML EPIDURAL INFUSION (ARMC-ANES): 10.0000 mL/h | EPIDURAL | Status: DC

## 2016-03-29 MED ORDER — IBUPROFEN 600 MG PO TABS
600.0000 mg | ORAL_TABLET | Freq: Four times a day (QID) | ORAL | Status: DC | PRN
  Administered 2016-03-30 – 2016-03-31 (×5): 600 mg via ORAL
  Filled 2016-03-29 (×5): qty 1

## 2016-03-29 MED ORDER — OXYCODONE HCL 5 MG PO TABS
5.0000 mg | ORAL_TABLET | ORAL | Status: DC | PRN
  Administered 2016-03-30 – 2016-03-31 (×6): 5 mg via ORAL
  Filled 2016-03-29 (×6): qty 1

## 2016-03-29 MED ORDER — LACTATED RINGERS IV SOLN
500.0000 mL | INTRAVENOUS | Status: DC | PRN
Start: 2016-03-29 — End: 2016-03-29

## 2016-03-29 MED ORDER — OXYTOCIN 40 UNITS IN LACTATED RINGERS INFUSION - SIMPLE MED
2.5000 [IU]/h | INTRAVENOUS | Status: DC
  Filled 2016-03-29: qty 1000

## 2016-03-29 MED ORDER — MISOPROSTOL 200 MCG PO TABS: 800.0000 ug | ORAL_TABLET | Freq: Once | ORAL | Status: DC | PRN

## 2016-03-29 MED ORDER — SIMETHICONE 80 MG PO CHEW: 80.0000 mg | CHEWABLE_TABLET | ORAL | Status: DC | PRN

## 2016-03-29 MED ORDER — OXYCODONE HCL 5 MG PO TABS: 10.0000 mg | ORAL_TABLET | ORAL | Status: DC | PRN

## 2016-03-29 MED ORDER — LIDOCAINE HCL (PF) 1 % IJ SOLN
30.0000 mL | INTRAMUSCULAR | Status: AC | PRN
  Administered 2016-03-29: 1 mL via SUBCUTANEOUS

## 2016-03-29 MED ORDER — OXYTOCIN BOLUS FROM INFUSION: 500.0000 mL | INTRAVENOUS | Status: DC

## 2016-03-29 MED ORDER — DIBUCAINE 1 % RE OINT: 1.0000 "application " | TOPICAL_OINTMENT | RECTAL | Status: DC | PRN

## 2016-03-29 MED ORDER — ONDANSETRON HCL 4 MG/2ML IJ SOLN: 4.0000 mg | INTRAMUSCULAR | Status: DC | PRN

## 2016-03-29 MED ORDER — OXYTOCIN 40 UNITS IN LACTATED RINGERS INFUSION - SIMPLE MED: 1.0000 m[IU]/min | INTRAVENOUS | Status: DC

## 2016-03-29 MED ORDER — AMMONIA AROMATIC IN INHA: 0.3000 mL | Freq: Once | RESPIRATORY_TRACT | Status: DC | PRN

## 2016-03-29 MED ORDER — SODIUM CHLORIDE FLUSH 0.9 % IV SOLN
INTRAVENOUS | Status: AC
  Filled 2016-03-29: qty 10

## 2016-03-29 MED ORDER — FENTANYL CITRATE (PF) 100 MCG/2ML IJ SOLN
50.0000 ug | INTRAMUSCULAR | Status: DC | PRN
  Administered 2016-03-29 (×2): 50 ug via INTRAVENOUS
  Filled 2016-03-29: qty 2

## 2016-03-29 MED ORDER — DOCUSATE SODIUM 100 MG PO CAPS
100.0000 mg | ORAL_CAPSULE | Freq: Two times a day (BID) | ORAL | Status: DC
  Administered 2016-03-30 (×2): 100 mg via ORAL
  Filled 2016-03-29 (×2): qty 1

## 2016-03-29 MED ORDER — FENTANYL 2.5 MCG/ML W/ROPIVACAINE 0.2% IN NS 100 ML EPIDURAL INFUSION (ARMC-ANES)
EPIDURAL | Status: AC
  Administered 2016-03-29: 10 mL/h via EPIDURAL
  Filled 2016-03-29: qty 100

## 2016-03-29 MED ORDER — COCONUT OIL OIL: 1.0000 "application " | TOPICAL_OIL | Status: DC | PRN

## 2016-03-29 MED ORDER — DIPHENHYDRAMINE HCL 50 MG/ML IJ SOLN: 12.5000 mg | INTRAMUSCULAR | Status: DC | PRN

## 2016-03-29 MED ORDER — ONDANSETRON HCL 4 MG/2ML IJ SOLN: 4.0000 mg | Freq: Four times a day (QID) | INTRAMUSCULAR | Status: DC | PRN

## 2016-03-29 NOTE — Discharge Instructions (Signed)
Vaginal Delivery, Care After Refer to this sheet in the next few weeks. These discharge instructions provide you with information on caring for yourself after delivery. Your caregiver may also give you specific instructions. Your treatment has been planned according to the most current medical practices available, but problems sometimes occur. Call your caregiver if you have any problems or questions after you go home. HOME CARE INSTRUCTIONS 1. Take over-the-counter or prescription medicines only as directed by your caregiver or pharmacist. 2. Do not drink alcohol, especially if you are breastfeeding or taking medicine to relieve pain. 3. Do not smoke tobacco. 4. Continue to use good perineal care. Good perineal care includes: 1. Wiping your perineum from back to front 2. Keeping your perineum clean. 3. You can do sitz baths twice a day, to help keep this area clean 5. Do not use tampons, douche or have sex until your caregiver says it is okay. 6. Shower only and avoid sitting in submerged water, aside from sitz baths 7. Wear a well-fitting bra that provides breast support. 8. Eat healthy foods. 9. Drink enough fluids to keep your urine clear or pale yellow. 10. Eat high-fiber foods such as whole grain cereals and breads, brown rice, beans, and fresh fruits and vegetables every day. These foods may help prevent or relieve constipation. 11. Avoid constipation with high fiber foods or medications, such as miralax or metamucil 12. Follow your caregiver's recommendations regarding resumption of activities such as climbing stairs, driving, lifting, exercising, or traveling. 13. No douching, tampons or intercourse x 6 weeks 14. Try to have someone help you with your household activities and your newborn for at least a few days after you leave the hospital. 15. Rest as much as possible. Try to rest or take a nap when your newborn is sleeping. 16. Increase your activities gradually. 17. Keep all of  your scheduled postpartum appointments. It is very important to keep your scheduled follow-up appointments. At these appointments, your caregiver will be checking to make sure that you are healing physically and emotionally. SEEK MEDICAL CARE IF:   You are passing large clots from your vagina. Save any clots to show your caregiver.  You have a foul smelling discharge from your vagina.  You have trouble urinating.  You are urinating frequently.  You have pain when you urinate.  You have a change in your bowel movements.  You have increasing redness, pain, or swelling near your vaginal incision (episiotomy) or vaginal tear.  You have pus draining from your episiotomy or vaginal tear.  Your episiotomy or vaginal tear is separating.  You have painful, hard, or reddened breasts.  You have a severe headache.  You have blurred vision or see spots.  You feel sad or depressed.  You have thoughts of hurting yourself or your newborn.  You have questions about your care, the care of your newborn, or medicines.  You are dizzy or light-headed.  You have a rash.  You have nausea or vomiting.  You were breastfeeding and have not had a menstrual period within 12 weeks after you stopped breastfeeding.  You are not breastfeeding and have not had a menstrual period by the 12th week after delivery.  You have a fever. SEEK IMMEDIATE MEDICAL CARE IF:   You have persistent pain.  You have chest pain.  You have shortness of breath.  You faint.  You have leg pain.  You have stomach pain.  Your vaginal bleeding saturates two or more sanitary pads in  1 hour. MAKE SURE YOU:   Understand these instructions.  Will watch your condition.  Will get help right away if you are not doing well or get worse. Document Released: 09/11/2000 Document Revised: 01/29/2014 Document Reviewed: 05/11/2012 [facility] Patient Information 2015 [place], [place]. This information is not intended to replace  advice given to you by your health care provider. Make sure you discuss any questions you have with your health care provider.  Sitz Bath A sitz bath is a warm water bath taken in the sitting position. The water covers only the hips and butt (buttocks). We recommend using one that fits in the toilet, to help with ease of use and cleanliness. It may be used for either healing or cleaning purposes. Sitz baths are also used to relieve pain, itching, or muscle tightening (spasms). The water may contain medicine. Moist heat will help you heal and relax.  HOME CARE  Take 3 to 4 sitz baths a day. 18. Fill the bathtub half-full with warm water. 19. Sit in the water and open the drain a little. 20. Turn on the warm water to keep the tub half-full. Keep the water running constantly. 21. Soak in the water for 15 to 20 minutes. 22. After the sitz bath, pat the affected area dry. GET HELP RIGHT AWAY IF: You get worse instead of better. Stop the sitz baths if you get worse. MAKE SURE YOU:  Understand these instructions.  Will watch your condition.  Will get help right away if you are not doing well or get worse. Document Released: 10/22/2004 Document Revised: 06/08/2012 Document Reviewed: 01/12/2011 [facility] Patient Information 2015 [place], [place]. This information is not intended to replace advice given to you by your health care provider. Make sure you discuss any questions you have with your health care provider.

## 2016-03-29 NOTE — Anesthesia Preprocedure Evaluation (Signed)
Anesthesia Evaluation  Patient identified by MRN, date of birth, ID band Patient awake    Reviewed: Allergy & Precautions, NPO status , Patient's Chart, lab work & pertinent test results  History of Anesthesia Complications (+) history of anesthetic complications  Airway Mallampati: II       Dental   Pulmonary neg pulmonary ROS,           Cardiovascular negative cardio ROS       Neuro/Psych negative neurological ROS  negative psych ROS   GI/Hepatic negative GI ROS, Neg liver ROS,   Endo/Other  negative endocrine ROS  Renal/GU negative Renal ROS  negative genitourinary   Musculoskeletal negative musculoskeletal ROS (+)   Abdominal   Peds negative pediatric ROS (+)  Hematology  (+) anemia ,   Anesthesia Other Findings   Reproductive/Obstetrics (+) Pregnancy                             Anesthesia Physical Anesthesia Plan  ASA: II  Anesthesia Plan: Epidural   Post-op Pain Management:    Induction:   Airway Management Planned:   Additional Equipment:   Intra-op Plan:   Post-operative Plan:   Informed Consent: I have reviewed the patients History and Physical, chart, labs and discussed the procedure including the risks, benefits and alternatives for the proposed anesthesia with the patient or authorized representative who has indicated his/her understanding and acceptance.     Plan Discussed with:   Anesthesia Plan Comments:         Anesthesia Quick Evaluation

## 2016-03-29 NOTE — OB Triage Note (Signed)
Ctx since 0740 this am. Reports ctx every for minutes. Denies any LOF. Reports good fetal movement. Kristy [name], Kristy [name]

## 2016-03-29 NOTE — Anesthesia Procedure Notes (Signed)
Epidural Patient location during procedure: OB Start time: 03/29/2016 5:18 PM End time: 03/29/2016 5:40 PM  Preanesthetic Checklist Completed: patient identified, site marked, surgical consent, pre-op evaluation, timeout performed, IV checked, risks and benefits discussed and monitors and equipment checked  Epidural Patient position: sitting Prep: Betadine Patient monitoring: heart rate, continuous pulse ox and blood pressure Approach: midline Location: L4-L5 Injection technique: LOR saline  Needle:  Needle type: Tuohy  Needle gauge: 17 G Needle length: 9 cm and 9 Needle insertion depth: 7 cm Catheter type: closed end flexible Catheter size: 20 Guage Catheter at skin depth: 11 cm Test dose: negative and 1.5% lidocaine with Epi 1:200 K  Assessment Events: blood not aspirated, injection not painful, no injection resistance, negative IV test and no paresthesia  Additional Notes   Patient tolerated the insertion well without complications.Reason for block:procedure for pain

## 2016-03-29 NOTE — H&P (Signed)
OB History & Physical   History of Present Illness:  Chief Complaint:  Complains of regular strong contractions since 0740 this AM. Denies bleeding or leakage of fluid HPI:  Kristy [name] is a 20 y.o. 353P1011 female at 8318w4d dated by LMP=10 week ultrasound.  Her pregnancy has been complicated by marijuana use in pregnancy, concerns for possible microcephaly (seen by DP and HC at 5%), an elevated one hour GTT with normal 3hr GTT, and iron deficiency anemia. .  She presents to L&D for evaluation of onset of labor.   [facility] Has received TDAP this pregnancy.01/28/2016. Has ordered her Nexplanon for pp contraception. Desires to bottle feed.      Maternal Medical History:   Past Medical History  Diagnosis Date  . Cholelithiasis   . Anemia affecting pregnancy   . Marijuana abuse     Past Surgical History  Procedure Laterality Date  . Cholecystectomy, laparoscopic  Feb 2016    No Known Allergies  Prior to Admission medications   Not on File          Social History: She  reports that she has never smoked. She has never used smokeless tobacco. She reports that she uses illicit drugs (Marijuana). She reports that she does not drink alcohol.  Family History: family history is not on file.   Review of Systems: Negative x 10 systems reviewed except as noted in the HPI.      Physical Exam:  Vital Signs: BP 128/68 mmHg  Pulse 96  Temp(Src) 98.1 F (36.7 C) (Oral)  Resp 16  Ht 5\' 2"  (1.575 m)  Wt 75.751 kg (167 lb)  BMI 30.54 kg/m2  LMP 06/26/2015 General: no acute distress.  HEENT: normocephalic, atraumatic Heart: regular rate & rhythm.  No murmurs Lungs: clear to auscultation bilaterally Abdomen: soft, gravid, non-tender;  EFW: 7# Pelvic:   External: Normal external female genitalia  Cervix: Dilation: 4 / Effacement (%): 80 / Station: -1   Extremities: non-tender, symmetric, trace edema bilaterally.  DTRs: +1 Neurologic: Alert &  oriented x 3.    Pertinent Results:  Prenatal Labs: Blood type/Rh A positive  Antibody screen negative  Rubella Varicella Immune immune  RPR negative  HBsAg negative  HIV negative  GC negative  Chlamydia negative  Genetic screening Neg First and MsAFP testing  1 hour GTT 141  3 hour GTT 79/ 160/141/125  GBS negative on 03/11/2016   Baseline FHR: 140 with accelerations to 150s, moderate variability Contractions: every 3-4 min apart, palpate moderate Overall assessment: Cat 1 tracing    Assessment:  Kristy [name] is a 20 y.o. 313P1011 female at 6918w4d with early labor  Plan:  1. Admit to Labor & Delivery  2. CBC, T&S, Clrs, IVF 3. GBS negative 4. Consents obtained. 5. Fentanyl for pain, epidural when appropriate 6. Bottle/ nexplanon 7. A POS/ RI/ VI  Nairi Oswald  03/29/2016 10:43 AM

## 2016-03-29 NOTE — Discharge Summary (Signed)
Physician Obstetric Discharge Summary  Patient ID: Kristy [name] MRN: [id number] DOB/AGE: June 21, 1996 21 y.o.   Date of Admission: 03/29/2016  Date of Discharge: 03/31/16  Admitting Diagnosis: Onset of Labor at 9785w4d  Secondary Diagnosis: Anemia in pregnancy  Mode of Delivery: normal spontaneous vaginal delivery 03/29/2016      Discharge Diagnosis: Term intrauterine pregnancy delivered   Intrapartum Procedures: Atificial rupture of membranes, epidural and pitocin augmentation   Post partum procedures: none  Complications: none  Brief Hospital Course  Kristy [name] is a [id number] who had a SVD on 7/2;  for further details of this delivery, please refer to the delivery note.  Patient had an uncomplicated postpartum course.  By time of discharge on PPD#2, her pain was controlled on oral pain medications; she had appropriate lochia and was ambulating, voiding without difficulty and tolerating regular diet.  She was deemed stable for discharge to home.     Labs: CBC Latest Ref Rng 03/30/2016 03/29/2016 02/23/2016  WBC 3.6 - 11.0 K/uL 10.3 8.8 8.5  Hemoglobin 12.0 - 16.0 g/dL 0.2(V8.7(L) 2.5(D9.4(L) 6.6(Y8.7(L)  Hematocrit 35.0 - 47.0 % 26.3(L) 28.4(L) 27.2(L)  Platelets 150 - 440 K/uL 166 192 183   A POS/ RI/ VI/ GBS negative  Physical exam:  Blood pressure 117/68, pulse 67, temperature 98.3 F (36.8 C), temperature source Oral, resp. rate 20, height 5\' 2"  (1.575 m), weight 75.751 kg (167 lb), last menstrual period 06/26/2015, SpO2 100 %, unknown if currently breastfeeding. General: alert and no distress Lochia: appropriate Abdomen: soft, NT Uterine Fundus: firm Extremities: No evidence of DVT seen on physical exam. No lower extremity edema.  Discharge Instructions: Per After Visit Summary. Activity: Advance as tolerated. Pelvic rest for 6 weeks.  Also refer to Discharge Instructions Diet: Regular Medications:   Medication List    TAKE these medications        ferrous sulfate 325 (65 FE) MG  tablet  Take 1 tablet (325 mg total) by mouth daily with breakfast.       Outpatient follow up:  Follow-up Information    Schedule an appointment as soon as possible for a visit with GUTIERREZ, COLLEEN, CNM.   Specialty:  Certified Nurse Midwife   Why:  6 week postpartum visit   Contact information:   [address] [place] [address] [phone]       Schedule an appointment as soon as possible for a visit with GUTIERREZ, COLLEEN, CNM.   Specialty:  Certified Nurse Midwife   Why:  in 1-2 weeks for Nexplanon insertion if desires   Contact information:   [address] [place] [address] [phone]      Postpartum contraception: Nexplanon  Discharged Condition: stable  Discharged to: home   Newborn Data: Disposition:home with mother  Apgars: APGAR (1 MIN): 8   APGAR (5 MINS): 9   APGAR (10 MINS):    Baby Feeding: Bottle/ Zhara  Letitia Libraobert Paul Abbygael Curtiss, MD 03/31/2016 9:32 AM

## 2016-03-29 NOTE — Progress Notes (Signed)
L&D Progress Note   S: Contractions getting more uncomfortable.Marland Kitchen.Marland Kitchen.Fentanyl helping a little  O: BP 123/73 mmHg  Pulse 102  Temp(Src) 98.7 F (37.1 C) (Oral)  Resp 16  Ht 5\' 2"  (1.575 m)  Wt 75.751 kg (167 lb)  BMI 30.54 kg/m2  SpO2 99%  LMP 06/26/2015  FHR: 130 with accelerations to 150s, moderate variability Toco: difficulty picking up contractions due to patient movement/rocking with contractions-appear to be q4-7 min apart Cervix: 5.5-6cm/C/-1 to 0  A: Progressing Cat 1 tracing  P: Epidural AROM after comfortable.  Farrel ConnersGUTIERREZ, Koryn Charlot, CNM

## 2016-03-30 LAB — RPR: RPR Ser Ql: NONREACTIVE

## 2016-03-30 LAB — CBC
HEMATOCRIT: 26.3 % — AB (ref 35.0–47.0)
HEMOGLOBIN: 8.7 g/dL — AB (ref 12.0–16.0)
MCH: 27.3 pg (ref 26.0–34.0)
MCHC: 33.1 g/dL (ref 32.0–36.0)
MCV: 82.5 fL (ref 80.0–100.0)
Platelets: 166 10*3/uL (ref 150–440)
RBC: 3.19 MIL/uL — ABNORMAL LOW (ref 3.80–5.20)
RDW: 17.1 % — ABNORMAL HIGH (ref 11.5–14.5)
WBC: 10.3 10*3/uL (ref 3.6–11.0)

## 2016-03-30 NOTE — Anesthesia Postprocedure Evaluation (Signed)
Anesthesia Post Note  Patient: Kristy HolmsKamyah T Sens  Procedure(s) Performed: * No procedures listed *  Patient location during evaluation: Mother Baby Anesthesia Type: Epidural Level of consciousness: awake, oriented and awake and alert Pain management: pain level controlled Vital Signs Assessment: post-procedure vital signs reviewed and stable Respiratory status: spontaneous breathing, nonlabored ventilation and respiratory function stable Cardiovascular status: stable Anesthetic complications: no Comments: Patient complains of minor backache, but states possibly due to laying in bed and not getting up and walking around. Patient states pain is well controlled and denies any other complaints. Site is clean, dry, and intact with bandaid.     Last Vitals:  Filed Vitals:   03/30/16 0023 03/30/16 0358  BP: 132/73 138/96  Pulse: 99 97  Temp: 36.9 C 36.8 C  Resp: 17 17    Last Pain:  Filed Vitals:   03/30/16 0442  PainSc: 3                  Microsofthuy Randy Castrejon

## 2016-03-30 NOTE — Plan of Care (Signed)
Problem: Nutritional: Goal: Mother's verbalization of comfort with breastfeeding process will improve Outcome: Not Applicable Date Met:  96/94/09 Patient is bottlefeeding infant.

## 2016-03-30 NOTE — Progress Notes (Signed)
  Postpartum Day 1  Subjective: no complaints, up ad lib, voiding and tolerating PO  Objective: Blood pressure 105/67, pulse 88, temperature 98.6 F (37 C), temperature source Oral, resp. rate 18, height 5\' 2"  (1.575 m), weight 167 lb (75.751 kg), last menstrual period 06/26/2015, SpO2 99 %,   Physical Exam:  General: alert and cooperative Lochia: appropriate Uterine Fundus: firm Incision: N/A DVT Evaluation: No evidence of DVT seen on physical exam. Abdomen: soft, NT   Recent Labs  03/29/16 1025 03/30/16 0434  HGB 9.4* 8.7*  HCT 28.4* 26.3*    Assessment PPD #1, Chronic iron-deficiency anemia with acute blood loss after delivery  Plan: Discharge tomorrow, Continue PP care, Advance activity as tolerated and Fe replacement, anemia precautions  Feeding: bottle Contraception: nexplanon (has insertion scheduled next week) Blood Type: A+  RI/VI TDAP UTD   Marta AntuBrothers, Alainah Phang, [place] 03/30/2016, 10:11 AM

## 2016-03-31 MED ORDER — FERROUS SULFATE 325 (65 FE) MG PO TABS: 325.0000 mg | ORAL_TABLET | Freq: Every day | ORAL | Status: DC

## 2016-03-31 NOTE — Progress Notes (Signed)
  Postpartum Day 2  Subjective: no complaints, up ad lib, voiding and tolerating PO  Objective: Blood pressure 105/67, pulse 88, temperature 98.6 F (37 C), temperature source Oral, resp. rate 18, height 5\' 2"  (1.575 m), weight 167 lb (75.751 kg), last menstrual period 06/26/2015, SpO2 99 %,   Physical Exam:  General: alert and cooperative Lochia: appropriate Uterine Fundus: firm Incision: N/A DVT Evaluation: No evidence of DVT seen on physical exam. Abdomen: soft, NT   Recent Labs  03/29/16 1025 03/30/16 0434  HGB 9.4* 8.7*  HCT 28.4* 26.3*    Assessment PPD #2, Chronic iron-deficiency anemia with acute blood loss after delivery  Plan: Continue PP care and Fe replacement, anemia precautions  Feeding: bottle Contraception: nexplanon (has insertion scheduled next week) Blood Type: A+  RI/VI TDAP UTD  Home today  Letitia Libraobert Paul Nathaneil Feagans, MD 03/31/2016, 9:33 AM

## 2016-03-31 NOTE — Progress Notes (Signed)
Patient discharged home with infant and significant other. Discharge instructions, prescriptions and follow up appointment given to and reviewed with patient and significant other. Patient verbalized understanding. Escorted out via wheelchair by Stacee Earp, RN. 

## 2016-07-17 ENCOUNTER — Encounter: Payer: Self-pay | Admitting: Emergency Medicine

## 2016-07-17 DIAGNOSIS — H1089 Other conjunctivitis: Secondary | ICD-10-CM | POA: Insufficient documentation

## 2016-07-17 DIAGNOSIS — H5713 Ocular pain, bilateral: Secondary | ICD-10-CM | POA: Diagnosis present

## 2016-07-17 NOTE — ED Triage Notes (Signed)
Right and left eye burning, redness, drainage since Sunday (right worse than left)

## 2016-07-18 ENCOUNTER — Emergency Department
Admission: EM | Admit: 2016-07-18 | Discharge: 2016-07-18 | Disposition: A | Discharge status: Home / Self Care | Payer: Medicaid Other | Attending: Emergency Medicine | Admitting: Emergency Medicine

## 2016-07-18 DIAGNOSIS — H1033 Unspecified acute conjunctivitis, bilateral: Secondary | ICD-10-CM

## 2016-07-18 MED ORDER — CIPROFLOXACIN HCL 0.3 % OP SOLN: 1.0000 [drp] | OPHTHALMIC | 0 refills | Status: AC

## 2016-07-18 MED ORDER — TETRACAINE HCL 0.5 % OP SOLN
OPHTHALMIC | Status: AC
  Filled 2016-07-18: qty 2

## 2016-07-18 MED ORDER — CIPROFLOXACIN HCL 0.3 % OP SOLN
2.0000 [drp] | OPHTHALMIC | Status: DC
  Administered 2016-07-18: 2 [drp] via OPHTHALMIC
  Filled 2016-07-18: qty 2.5

## 2016-07-18 MED ORDER — TETRACAINE HCL 0.5 % OP SOLN
1.0000 [drp] | Freq: Once | OPHTHALMIC | Status: AC
  Administered 2016-07-18: 1 [drp] via OPHTHALMIC

## 2016-07-18 NOTE — ED Provider Notes (Signed)
[facility] Provider Note    First MD Initiated Contact with Patient 07/18/16 0050     (approximate)  I have reviewed the triage vital signs and the nursing notes.   HISTORY  Chief Complaint Eye Problem   HPI Kristy [name] is a 20 y.o. female presents with bilateral eye pain and redness drainage 5 days. Of note patient states her infant has conjunctivitis. Patient denies any fever no nausea vomiting no neck pain   Past Medical History:  Diagnosis Date  . Anemia affecting pregnancy   . Cholelithiasis   . Marijuana abuse     Patient Active Problem List   Diagnosis Date Noted  . Postpartum care following vaginal delivery 03/29/2016    Past Surgical History:  Procedure Laterality Date  . CHOLECYSTECTOMY, LAPAROSCOPIC  Feb 2016    Prior to Admission medications   Medication Sig Start Date End Date Taking? Authorizing Provider  ciprofloxacin (CILOXAN) 0.3 % ophthalmic solution Place 1 drop into both eyes every 2 (two) hours. Administer 1 drop, every 2 hours, while awake, for 2 days. Then 1 drop, every 4 hours, while awake, for the next 5 days. 07/18/16 07/23/16  Darci Currentandolph N Zayvon Alicea, MD  ferrous sulfate 325 (65 FE) MG tablet Take 1 tablet (325 mg total) by mouth daily with breakfast. 03/31/16   Nadara Mustardobert P Harris, MD    Allergies No known drug allergies No family history on file.  Social History Social History  Substance Use Topics  . Smoking status: Never Smoker  . Smokeless tobacco: Never Used  . Alcohol use No    Review of Systems Constitutional: No fever/chills Eyes: Positive for bilateral eye pain, redness and drainage ENT: No sore throat. Cardiovascular: Denies chest pain. Respiratory: Denies shortness of breath. Gastrointestinal: No abdominal pain.  No nausea, no vomiting.  No diarrhea.  No constipation. Genitourinary: Negative for dysuria. Musculoskeletal: Negative for back pain. Skin: Negative for  rash. Neurological: Negative for headaches, focal weakness or numbness.  10-point ROS otherwise negative.  ____________________________________________   PHYSICAL EXAM:  VITAL SIGNS: ED Triage Vitals  Enc Vitals Group     BP 07/17/16 2312 117/69     Pulse Rate 07/17/16 2312 100     Resp 07/17/16 2312 17     Temp 07/17/16 2312 98.5 F (36.9 C)     Temp Source 07/17/16 2312 Oral     SpO2 07/17/16 2312 99 %     Weight 07/17/16 2313 160 lb (72.6 kg)     Height 07/17/16 2313 5\' 1"  (1.549 m)     Head Circumference --      Peak Flow --      Pain Score 07/17/16 2315 5     Pain Loc --      Pain Edu? --      Excl. in GC? --     Constitutional: Alert and oriented. Well appearing and in no acute distress. Eyes:Bilateral conjunctival erythema and injection exudate noted bilateral eyelashes Head: Atraumatic. Ears:  Healthy appearing ear canals and TMs bilaterally Nose: No congestion/rhinnorhea. Mouth/Throat: Mucous membranes are moist.  Oropharynx non-erythematous. Neck: No stridor.  No meningeal signs.   Cardiovascular: Normal rate, regular rhythm. Good peripheral circulation. Grossly normal heart sounds. Respiratory: Normal respiratory effort.  No retractions. Lungs CTAB. Gastrointestinal: Soft and nontender. No distention.  Musculoskeletal: No lower extremity tenderness nor edema. No gross deformities of extremities. Neurologic:  Normal speech and language. No gross focal neurologic deficits are appreciated.  Skin:  Skin is warm, dry and intact. No rash noted. Psychiatric: Mood and affect are normal. Speech and behavior are normal.   Procedures    INITIAL IMPRESSION / ASSESSMENT AND PLAN / ED COURSE  Pertinent labs & imaging results that were available during my care of the patient were reviewed by me and considered in my medical decision making (see chart for details).  Cipro ophthalmic applied to bilateral eyes   Clinical Course     ____________________________________________  FINAL CLINICAL IMPRESSION(S) / ED DIAGNOSES  Final diagnoses:  Acute bacterial conjunctivitis of both eyes     MEDICATIONS GIVEN DURING THIS VISIT:  Medications  tetracaine (PONTOCAINE) 0.5 % ophthalmic solution (not administered)  ciprofloxacin (CILOXAN) 0.3 % ophthalmic solution 2 drop (not administered)  tetracaine (PONTOCAINE) 0.5 % ophthalmic solution 1 drop (not administered)     NEW OUTPATIENT MEDICATIONS STARTED DURING THIS VISIT:  New Prescriptions   CIPROFLOXACIN (CILOXAN) 0.3 % OPHTHALMIC SOLUTION    Place 1 drop into both eyes every 2 (two) hours. Administer 1 drop, every 2 hours, while awake, for 2 days. Then 1 drop, every 4 hours, while awake, for the next 5 days.    Modified Medications   No medications on file    Discontinued Medications   No medications on file     Note:  This document was prepared using Dragon voice recognition software and may include unintentional dictation errors.    Darci Current, MD 07/18/16 [phone]

## 2016-07-21 ENCOUNTER — Encounter: Payer: Self-pay | Admitting: Emergency Medicine

## 2016-07-21 ENCOUNTER — Emergency Department
Admission: EM | Admit: 2016-07-21 | Discharge: 2016-07-21 | Disposition: A | Discharge status: Home / Self Care | Payer: Medicaid Other | Attending: Emergency Medicine | Admitting: Emergency Medicine

## 2016-07-21 DIAGNOSIS — H1033 Unspecified acute conjunctivitis, bilateral: Secondary | ICD-10-CM | POA: Insufficient documentation

## 2016-07-21 NOTE — Discharge Instructions (Signed)
Go directly to the [facility] and tell them at the check-in desk that Dr. Druscilla BrowniePorfilio was going to call to let them know to expect you.

## 2016-07-21 NOTE — ED Notes (Signed)
See triage note  Was seen couple of days ago for conjunctivitis  Has been using the eye drops and feels like it is worse

## 2016-07-21 NOTE — ED Triage Notes (Signed)
Pt to ED c/o bilateral eye conjunctivitis x1 week.  Pt seen here and given eye drops, states not getting better.  Pt with swelling and drainage.  States daughter had pink eye.

## 2016-07-21 NOTE — ED Provider Notes (Signed)
[facility] Emergency Department Provider Note  ____________________________________________   First MD Initiated Contact with Patient 07/21/16 1418     (approximate)  I have reviewed the triage vital signs and the nursing notes.   HISTORY  Chief Complaint Conjunctivitis    HPI Kristy [name] is a 20 y.o. female who presents for reevaluation of persistent bilateral eye pain, redness, and drainage that has now been going on for about 8 days.  She was seen 3 nights ago and started on Cipro drops for presumed bilateral acute bacterial conjunctivitis.  She states that her symptoms started originally when her infant got conjunctivitis and given to her.  Her infant's symptoms have cleared up but her symptoms keep getting worse in spite of being here and to her medication regimen.  She states that she has severe pain at all times and that bright lights make it worse.She has thick discharge that mats together the lashes.  No headache, neck pain, nausea, vomiting, chest pain, shortness of breath, abdominal pain.  She describes his symptoms as severe and they are getting worse in spite of treatment.   Past Medical History:  Diagnosis Date  . Anemia affecting pregnancy   . Cholelithiasis   . Marijuana abuse     Patient Active Problem List   Diagnosis Date Noted  . Postpartum care following vaginal delivery 03/29/2016    Past Surgical History:  Procedure Laterality Date  . CHOLECYSTECTOMY, LAPAROSCOPIC  Feb 2016    Prior to Admission medications   Medication Sig Start Date End Date Taking? Authorizing Provider  ciprofloxacin (CILOXAN) 0.3 % ophthalmic solution Place 1 drop into both eyes every 2 (two) hours. Administer 1 drop, every 2 hours, while awake, for 2 days. Then 1 drop, every 4 hours, while awake, for the next 5 days. 07/18/16 07/23/16  Darci Currentandolph N Brown, MD  ferrous sulfate 325 (65 FE) MG tablet Take 1 tablet (325 mg total) by mouth daily with  breakfast. 03/31/16   Nadara Mustardobert P Harris, MD    Allergies Review of patient's allergies indicates no known allergies.  History reviewed. No pertinent family history.  Social History Social History  Substance Use Topics  . Smoking status: Never Smoker  . Smokeless tobacco: Never Used  . Alcohol use No    Review of Systems Constitutional: No fever/chills Eyes: Bilateral eye redness, discharge, pain, and photophobia ENT: No sore throat. Cardiovascular: Denies chest pain. Respiratory: Denies shortness of breath. Gastrointestinal: No abdominal pain.  No nausea, no vomiting.  No diarrhea.  No constipation. Genitourinary: Negative for dysuria. Musculoskeletal: Negative for back pain. Skin: Negative for rash. Neurological: Negative for headaches, focal weakness or numbness.  10-point ROS otherwise negative.  ____________________________________________   PHYSICAL EXAM:  VITAL SIGNS: ED Triage Vitals  Enc Vitals Group     BP 07/21/16 1409 (!) 114/41     Pulse Rate 07/21/16 1409 96     Resp 07/21/16 1409 16     Temp 07/21/16 1409 98.7 F (37.1 C)     Temp Source 07/21/16 1409 Oral     SpO2 07/21/16 1409 99 %     Weight 07/21/16 1410 160 lb (72.6 kg)     Height 07/21/16 1410 5\' 1"  (1.549 m)     Head Circumference --      Peak Flow --      Pain Score 07/21/16 1410 10     Pain Loc --      Pain Edu? --  Excl. in GC? --     Constitutional: Alert and oriented. Appears uncomfortable and is squinting Eyes: Severe bilateral conjunctival injection and exudate.  Mild chemosis bilaterally.  Pupils are equal and reactive.  Extraocular motion is intact. Head: Atraumatic. Nose: No congestion/rhinnorhea. Neck: No stridor.  No meningeal signs.   Cardiovascular: Normal rate, regular rhythm. Good peripheral circulation. Grossly normal heart sounds. Neurologic:  Normal speech and language. No gross focal neurologic deficits are appreciated.  Skin:  Skin is warm, dry and intact. No rash  noted. Psychiatric: Mood and affect are normal. Speech and behavior are normal.  ____________________________________________   LABS (all labs ordered are listed, but only abnormal results are displayed)  Labs Reviewed - No data to display  ____________________________________________   PROCEDURES  Procedure(s) performed:   Procedures   Critical Care performed: No ____________________________________________   INITIAL IMPRESSION / ASSESSMENT AND PLAN / ED COURSE  Pertinent labs & imaging results that were available during my care of the patient were reviewed by me and considered in my medical decision making (see chart for details).  Given that the patient has already been treated for 3 days and is getting worse, I believe that she needs a specialist evaluation.  I called by phone and spoke with Dr. Druscilla Brownie and he is going to call over to the [facility] and told them to expect her as soon as she can get over there so that she can be seen this afternoon.  I will not place any drops at this time so as not to affect their exam.  I encouraged the patient to go directly to the [facility] and I provided her with the name and address and phone number.    I gave my usual and customary return precautions.       ____________________________________________  FINAL CLINICAL IMPRESSION(S) / ED DIAGNOSES  Final diagnoses:  Acute conjunctivitis of both eyes, unspecified acute conjunctivitis type     MEDICATIONS GIVEN DURING THIS VISIT:  Medications - No data to display   NEW OUTPATIENT MEDICATIONS STARTED DURING THIS VISIT:  New Prescriptions   No medications on file    Modified Medications   No medications on file    Discontinued Medications   No medications on file     Note:  This document was prepared using Dragon voice recognition software and may include unintentional dictation errors.    Loleta Rose, MD 07/21/16 [phone]

## 2016-12-26 ENCOUNTER — Ambulatory Visit: Payer: Medicaid Other

## 2016-12-26 ENCOUNTER — Encounter: Payer: Self-pay | Admitting: Emergency Medicine

## 2016-12-26 ENCOUNTER — Ambulatory Visit
Admission: EM | Admit: 2016-12-26 | Discharge: 2016-12-26 | Disposition: A | Discharge status: Home / Self Care | Payer: Medicaid Other | Attending: Family Medicine | Admitting: Family Medicine

## 2016-12-26 DIAGNOSIS — W450XXA Nail entering through skin, initial encounter: Secondary | ICD-10-CM | POA: Diagnosis not present

## 2016-12-26 DIAGNOSIS — Z79899 Other long term (current) drug therapy: Secondary | ICD-10-CM | POA: Diagnosis not present

## 2016-12-26 DIAGNOSIS — Z23 Encounter for immunization: Secondary | ICD-10-CM | POA: Diagnosis not present

## 2016-12-26 DIAGNOSIS — Z9049 Acquired absence of other specified parts of digestive tract: Secondary | ICD-10-CM | POA: Diagnosis not present

## 2016-12-26 DIAGNOSIS — S91331A Puncture wound without foreign body, right foot, initial encounter: Secondary | ICD-10-CM | POA: Diagnosis present

## 2016-12-26 MED ORDER — CIPROFLOXACIN HCL 500 MG PO TABS: 500.0000 mg | ORAL_TABLET | Freq: Two times a day (BID) | ORAL | 0 refills | Status: DC

## 2016-12-26 MED ORDER — TETANUS-DIPHTH-ACELL PERTUSSIS 5-2.5-18.5 LF-MCG/0.5 IM SUSP
0.5000 mL | Freq: Once | INTRAMUSCULAR | Status: AC
  Administered 2016-12-26: 0.5 mL via INTRAMUSCULAR

## 2016-12-26 NOTE — ED Triage Notes (Signed)
Patient states that she stepped on a nail with her right foot about 2 weeks ago.  patient reports peeling on the bottom of her foot and increase in pain.

## 2016-12-26 NOTE — ED Provider Notes (Signed)
[facility]    CSN: [id number] Arrival date & time: 12/26/16  0841     History   Chief Complaint Chief Complaint  Patient presents with  . Puncture Wound    HPI [place] COHICK is a 21 y.o. female.   21 yo female with a c/o puncture wound to bottom of foot (heel area) after stepping on a nail about 2 weeks ago. States continues with pain and mild swelling.  States the nail went through her flip-flops and through her skin. Denies any drainage, fevers, chills.    The history is provided by the patient.    Past Medical History:  Diagnosis Date  . Anemia affecting pregnancy   . Cholelithiasis   . Marijuana abuse     Patient Active Problem List   Diagnosis Date Noted  . Postpartum care following vaginal delivery 03/29/2016    Past Surgical History:  Procedure Laterality Date  . CHOLECYSTECTOMY, LAPAROSCOPIC  Feb 2016    OB History    Gravida Para Term Preterm AB Living   0 1 2   SAB TAB Ectopic Multiple Live Births   0 0 0 0 2       Home Medications    Prior to Admission medications   Medication Sig Start Date End Date Taking? Authorizing Provider  etonogestrel (NEXPLANON) 68 MG IMPL implant 1 each by Subdermal route once.   Yes Historical Provider, MD  ciprofloxacin (CIPRO) 500 MG tablet Take 1 tablet (500 mg total) by mouth every 12 (twelve) hours. 12/26/16   Payton Mccallum, MD    Family History History reviewed. No pertinent family history.  Social History Social History  Substance Use Topics  . Smoking status: Never Smoker  . Smokeless tobacco: Never Used  . Alcohol use No     Allergies   Patient has no known allergies.   Review of Systems Review of Systems   Physical Exam Triage Vital Signs ED Triage Vitals  Enc Vitals Group     BP 12/26/16 0851 119/90     Pulse Rate 12/26/16 0851 84     Resp 12/26/16 0851 16     Temp 12/26/16 0851 98 F (36.7 C)     Temp Source 12/26/16 0851 Oral     SpO2 12/26/16 0851 99 %   Weight 12/26/16 0851 160 lb (72.6 kg)     Height 12/26/16 0851  (1.549 m)     Head Circumference --      Peak Flow --      Pain Score 12/26/16 0852 1     Pain Loc --      Pain Edu? --      Excl. in GC? --    No data found.   Updated Vital Signs BP 119/90 (BP Location: Left Arm)   Pulse 84   Temp 98 F (36.7 C) (Oral)   Resp 16   Ht  (1.549 m)   Wt 160 lb (72.6 kg)   LMP 12/26/2016   SpO2 99%   Breastfeeding? No   BMI 30.23 kg/m   Visual Acuity Right Eye Distance:   Left Eye Distance:   Bilateral Distance:    Right Eye Near:   Left Eye Near:    Bilateral Near:     Physical Exam  Constitutional: She appears well-developed and well-nourished. No distress.  Musculoskeletal:       Right foot: There is tenderness (mild/moderate on plantar heel area) and swelling (mild/moderate on plantar  heel area). There is normal range of motion, no bony tenderness, normal capillary refill, no crepitus, no deformity and no laceration.  Skin: She is not diaphoretic.  Nursing note and vitals reviewed.    UC Treatments / Results  Labs (all labs ordered are listed, but only abnormal results are displayed) Labs Reviewed - No data to display  EKG  EKG Interpretation None       Radiology Dg Foot Complete Right  Result Date: 12/26/2016 CLINICAL DATA:  Right heel pain after stepping on a nail 2 weeks ago. EXAM: RIGHT FOOT COMPLETE - 3+ VIEW COMPARISON:  None. FINDINGS: Small soft tissue defect in the plantar aspect of the heel. No fracture, bone destruction or periosteal reaction. No soft tissue gas or radiopaque foreign body. IMPRESSION: Small soft tissue defect in the plantar aspect of the heel compatible with the site of puncture wound. No fracture or radiopaque foreign body. Electronically Signed   By: Beckie Salts M.D.   On: 12/26/2016 09:32    Procedures Procedures (including critical care time)  Medications Ordered in UC Medications  Tdap (BOOSTRIX) injection 0.5  mL (0.5 mLs Intramuscular Given 12/26/16 0858)     Initial Impression / Assessment and Plan / UC Course  I have reviewed the triage vital signs and the nursing notes.  Pertinent labs & imaging results that were available during my care of the patient were reviewed by me and considered in my medical decision making (see chart for details).       Final Clinical Impressions(s) / UC Diagnoses   Final diagnoses:  Puncture wound of right foot, initial encounter    New Prescriptions Discharge Medication List as of 12/26/2016  9:38 AM    START taking these medications   Details  ciprofloxacin (CIPRO) 500 MG tablet Take 1 tablet (500 mg total) by mouth every 12 (twelve) hours., Starting Sat 12/26/2016, Normal       1. X-ray results (negative for fracture or foreign body) and diagnosis reviewed with patient 2. rx as per orders above; reviewed possible side effects, interactions, risks and benefits  3. Patient given Tetanus (Tdap) 4. Recommend supportive treatment with elevation, rest, warm compresses 5. Follow-up prn if symptoms worsen or don't improve   Payton Mccallum, MD 12/26/16 1204

## 2017-11-29 ENCOUNTER — Emergency Department
Admission: EM | Admit: 2017-11-29 | Discharge: 2017-11-29 | Disposition: A | Discharge status: Home / Self Care | Payer: Self-pay | Attending: Emergency Medicine | Admitting: Emergency Medicine

## 2017-11-29 ENCOUNTER — Other Ambulatory Visit: Payer: Self-pay

## 2017-11-29 ENCOUNTER — Encounter: Payer: Self-pay | Admitting: Intensive Care

## 2017-11-29 DIAGNOSIS — Z9049 Acquired absence of other specified parts of digestive tract: Secondary | ICD-10-CM | POA: Insufficient documentation

## 2017-11-29 DIAGNOSIS — Z79899 Other long term (current) drug therapy: Secondary | ICD-10-CM | POA: Insufficient documentation

## 2017-11-29 DIAGNOSIS — J111 Influenza due to unidentified influenza virus with other respiratory manifestations: Secondary | ICD-10-CM | POA: Insufficient documentation

## 2017-11-29 DIAGNOSIS — F1210 Cannabis abuse, uncomplicated: Secondary | ICD-10-CM | POA: Insufficient documentation

## 2017-11-29 MED ORDER — GUAIFENESIN-CODEINE 100-10 MG/5ML PO SOLN: 5.0000 mL | ORAL | 0 refills | Status: DC | PRN

## 2017-11-29 MED ORDER — OSELTAMIVIR PHOSPHATE 75 MG PO CAPS: 75.0000 mg | ORAL_CAPSULE | Freq: Two times a day (BID) | ORAL | 0 refills | Status: AC

## 2017-11-29 NOTE — Discharge Instructions (Signed)
Follow-up with [facility] acute care if any continued problems.  Begin taking Robitussin AC for cough and congestion.  This medication contains a narcotic so do not drive or operate machinery while taking it.  Begin taking Tamiflu twice daily for next 5 days.  Continue Tylenol or ibuprofen as needed for body aches and fever.  Increase fluids.

## 2017-11-29 NOTE — ED Notes (Signed)
See triage note  Presents with body aches and subjective fever that started yesterday  Low grade fever on arrival  Occasional cough

## 2017-11-29 NOTE — ED Provider Notes (Signed)
[facility] Emergency Department Provider Note   ____________________________________________   First MD Initiated Contact with Patient 11/29/17 0900     (approximate)  I have reviewed the triage vital signs and the nursing notes.   HISTORY  Chief Complaint Generalized Body Aches and Fever  HPI Kristy [name] is a 22 y.o. female is here with complaint flulike symptoms that began suddenly yesterday.  Patient states she has had a fever, congestion, body aches and headache.  Patient has been taking medication for fever.    Past Medical History:  Diagnosis Date  . Anemia affecting pregnancy   . Cholelithiasis   . Marijuana abuse     Patient Active Problem List   Diagnosis Date Noted  . Postpartum care following vaginal delivery 03/29/2016    Past Surgical History:  Procedure Laterality Date  . CHOLECYSTECTOMY, LAPAROSCOPIC  Feb 2016    Prior to Admission medications   Medication Sig Start Date End Date Taking? Authorizing Provider  etonogestrel (NEXPLANON) 68 MG IMPL implant 1 each by Subdermal route once.    [provider]  guaiFENesin-codeine 100-10 MG/5ML syrup Take 5 mLs by mouth every 4 (four) hours as needed. 11/29/17   Tommi RumpsSummers, Avacyn Kloosterman L, PA-C  oseltamivir (TAMIFLU) 75 MG capsule Take 1 capsule (75 mg total) by mouth 2 (two) times daily for 5 days. 11/29/17 12/04/17  Tommi RumpsSummers, Mahagony Grieb L, PA-C    Allergies Patient has no known allergies.  History reviewed. No pertinent family history.  Social History Social History   Tobacco Use  . Smoking status: Never Smoker  . Smokeless tobacco: Never Used  Substance Use Topics  . Alcohol use: No  . Drug use: Yes    Types: Marijuana    Comment: Last use october 2016    Review of Systems Constitutional: Positive fever/chills Eyes: No visual changes. ENT: No sore throat.  Positive nasal congestion. Cardiovascular: Denies chest pain. Respiratory: Denies shortness of breath.  Positive  cough. Gastrointestinal: No abdominal pain.  No nausea, no vomiting.  Musculoskeletal: Negative for back pain. Skin: Negative for rash. Neurological: Negative for headaches, focal weakness or numbness. ___________________________________________   PHYSICAL EXAM:  VITAL SIGNS: ED Triage Vitals  Enc Vitals Group     BP 11/29/17 0841 120/83     Pulse Rate 11/29/17 0841 (!) 103     Resp 11/29/17 0841 18     Temp 11/29/17 0841 99 F (37.2 C)     Temp Source 11/29/17 0841 Oral     SpO2 11/29/17 0841 99 %     Weight 11/29/17 0842 145 lb (65.8 kg)     Height 11/29/17 0842 5\' 1"  (1.549 m)     Head Circumference --      Peak Flow --      Pain Score 11/29/17 0841 10     Pain Loc --      Pain Edu? --      Excl. in GC? --    Constitutional: Alert and oriented. Well appearing and in no acute distress. Eyes: Conjunctivae are normal.  Head: Atraumatic. Nose: Moderate congestion/rhinnorhea.  TMs are dull bilaterally. Mouth/Throat: Mucous membranes are moist.  Oropharynx non-erythematous. Neck: No stridor.   Hematological/Lymphatic/Immunilogical: No cervical lymphadenopathy. Cardiovascular: Normal rate, regular rhythm. Grossly normal heart sounds.  Good peripheral circulation. Respiratory: Normal respiratory effort.  No retractions. Lungs CTAB. Musculoskeletal: Moves upper and lower extremities without any difficulty.  Normal gait was noted. Neurologic:  Normal speech and language. No gross focal neurologic deficits are  appreciated.  Skin:  Skin is warm, dry and intact. No rash noted. Psychiatric: Mood and affect are normal. Speech and behavior are normal.  ____________________________________________   LABS (all labs ordered are listed, but only abnormal results are displayed)  Labs Reviewed - No data to display   PROCEDURES  Procedure(s) performed: None  Procedures  Critical Care performed: No  ____________________________________________   INITIAL IMPRESSION /  ASSESSMENT AND PLAN / ED COURSE Based on patient's symptoms she was placed on Tamiflu 35 mg twice daily for 5 days and Robitussin-AC as needed for cough and congestion.  Patient is to increase fluids and continue with Tylenol or ibuprofen as needed for fever.  Patient will follow-up with [facility] acute care if any continued problems.   ____________________________________________   FINAL CLINICAL IMPRESSION(S) / ED DIAGNOSES  Final diagnoses:  Influenza     ED Discharge Orders        Ordered    guaiFENesin-codeine 100-10 MG/5ML syrup  Every 4 hours PRN     11/29/17 0949    oseltamivir (TAMIFLU) 75 MG capsule  2 times daily     11/29/17 0949       Note:  This document was prepared using Dragon voice recognition software and may include unintentional dictation errors.    Tommi Rumps, PA-C 11/29/17 1115    Jeanmarie Plant, MD 11/29/17 1146

## 2017-11-29 NOTE — ED Triage Notes (Signed)
Patient c/o flu like symptoms since yesterday. Last took advil today. Ambulatory in triage with no problems. A&O x4

## 2018-01-28 ENCOUNTER — Ambulatory Visit: Payer: Self-pay | Admitting: Certified Nurse Midwife

## 2018-09-28 NOTE — L&D Delivery Note (Signed)
Delivery Note At 9:57 PM a viable female was delivered via Vaginal, Spontaneous (Presentation:VTX roa ;  ).  APGAR: 8, 9; weight  .   Placenta status:intact  , .    with the following complications:none .   Anesthesia:  cle Episiotomy: None Lacerations:  none Suture Repair: n/a Est. Blood Loss (mL):  100 cc Rapid progression from 5 cm to C/c . Delivery of head and body with 2 sets of pushes. Noted hyperpigmentation of majority of lower posterior back and buttock . Mother tolerated well  Mom to postpartum.  Baby to Couplet care / Skin to Skin.  Ihor Austin Medea Deines 12/06/2018, 10:03 PM

## 2018-11-27 ENCOUNTER — Observation Stay
Admission: EM | Admit: 2018-11-27 | Discharge: 2018-11-27 | Disposition: A | Discharge status: Home / Self Care | Payer: Medicaid Other | Attending: Obstetrics and Gynecology | Admitting: Obstetrics and Gynecology

## 2018-11-27 ENCOUNTER — Other Ambulatory Visit: Payer: Self-pay

## 2018-11-27 ENCOUNTER — Encounter: Payer: Self-pay | Admitting: *Deleted

## 2018-11-27 DIAGNOSIS — O99323 Drug use complicating pregnancy, third trimester: Secondary | ICD-10-CM | POA: Diagnosis not present

## 2018-11-27 DIAGNOSIS — O99820 Streptococcus B carrier state complicating pregnancy: Secondary | ICD-10-CM | POA: Insufficient documentation

## 2018-11-27 DIAGNOSIS — O99013 Anemia complicating pregnancy, third trimester: Principal | ICD-10-CM | POA: Diagnosis present

## 2018-11-27 DIAGNOSIS — D649 Anemia, unspecified: Secondary | ICD-10-CM | POA: Diagnosis not present

## 2018-11-27 DIAGNOSIS — Z3A37 37 weeks gestation of pregnancy: Secondary | ICD-10-CM | POA: Diagnosis not present

## 2018-11-27 DIAGNOSIS — F1290 Cannabis use, unspecified, uncomplicated: Secondary | ICD-10-CM | POA: Insufficient documentation

## 2018-11-27 LAB — HEMOGLOBIN AND HEMATOCRIT, BLOOD
HCT: 28.6 % — ABNORMAL LOW (ref 36.0–46.0)
HCT: 30.1 % — ABNORMAL LOW (ref 36.0–46.0)
Hemoglobin: 9 g/dL — ABNORMAL LOW (ref 12.0–15.0)
Hemoglobin: 9.5 g/dL — ABNORMAL LOW (ref 12.0–15.0)

## 2018-11-27 LAB — PREPARE RBC (CROSSMATCH)

## 2018-11-27 MED ORDER — ACETAMINOPHEN 325 MG PO TABS
650.0000 mg | ORAL_TABLET | Freq: Once | ORAL | Status: AC
  Administered 2018-11-27: 650 mg via ORAL
  Filled 2018-11-27: qty 2

## 2018-11-27 MED ORDER — SODIUM CHLORIDE 0.9% IV SOLUTION
Freq: Once | INTRAVENOUS | Status: AC
  Administered 2018-11-27: 10:00:00 via INTRAVENOUS

## 2018-11-27 MED ORDER — FERROUS SULFATE 325 (65 FE) MG PO TABS: 325.0000 mg | ORAL_TABLET | Freq: Two times a day (BID) | ORAL | 1 refills | Status: DC

## 2018-11-27 MED ORDER — SODIUM CHLORIDE 0.9% IV SOLUTION: Freq: Once | INTRAVENOUS | Status: DC

## 2018-11-27 MED ORDER — DIPHENHYDRAMINE HCL 25 MG PO CAPS
25.0000 mg | ORAL_CAPSULE | Freq: Once | ORAL | Status: AC
  Administered 2018-11-27: 25 mg via ORAL
  Filled 2018-11-27: qty 1

## 2018-11-27 MED ORDER — VITAMIN C 250 MG PO TABS: 250.0000 mg | ORAL_TABLET | Freq: Every day | ORAL | 1 refills | Status: DC

## 2018-11-27 NOTE — OB Triage Note (Signed)
Discharge home. Left floor ambulatory with significant other.

## 2018-11-27 NOTE — Discharge Summary (Signed)
PARASKEVI HOSTON is a 23 y.o. female. She is at [redacted]w[redacted]d gestation. No LMP recorded. Patient is pregnant. Estimated Date of Delivery: 12/14/18  Prenatal care site: [facility]   Current pregnancy complicated by:  1. Anemia, Hgb at 37.2wks: 8.7 2. Chlamydia 11/17/18- tx on 11/21/18 3. MJ use, off and on throughout pregnancy, counseled to quit.  4. GBS Pos  Chief complaint: Here for blood transfusion due to symptomatic anemia  Location: n/a Onset/timing: n/a Duration: n/a Quality: n/a Severity: n/a Aggravating or alleviating conditions: Hgb 8.7 at office blood draw on Friday afternoon.  Associated signs/symptoms: HA, fatigue.  Context: n/a  S: Resting comfortably. no CTX, no VB.no LOF,  Active fetal movement.  Denies: HA, visual changes, SOB, or RUQ/epigastric pain  Maternal Medical History:   Past Medical History:  Diagnosis Date  . Anemia affecting pregnancy   . Cholelithiasis   . Marijuana abuse     Past Surgical History:  Procedure Laterality Date  . CHOLECYSTECTOMY, LAPAROSCOPIC  Feb 2016    No Known Allergies  Prior to Admission medications   Not on File      Social History: She  reports that she has never smoked. She has never used smokeless tobacco. She reports current drug use. Drug: Marijuana. She reports that she does not drink alcohol.  Family History:  no history of gyn cancers  Review of Systems: A full review of systems was performed and negative except as noted in the HPI.     O:  BP (!) 97/48   Pulse 93   Temp 98.1 F (36.7 C) (Oral)   Resp 18   Ht 5\' 1"  (1.549 m)   Wt 75.3 kg   BMI 31.37 kg/m  Results for orders placed or performed during the hospital encounter of 11/27/18 (from the past 48 hour(s))  Prepare RBC   Collection Time: 11/27/18  9:02 AM  Result Value Ref Range   Order Confirmation      ORDER PROCESSED BY BLOOD BANK Performed at [facility], [address] [place], [place] [address]   Hemoglobin and  Hematocrit   Collection Time: 11/27/18  9:22 AM  Result Value Ref Range   Hemoglobin 9.0 (L) 12.0 - 15.0 g/dL   HCT 09.7 (L) 35.3 - 29.9 %  Type and screen [facility]   Collection Time: 11/27/18  9:22 AM  Result Value Ref Range   ABO/RH(D) A POS    Antibody Screen NEG    Sample Expiration 11/30/2018    Unit Number [id number]    Blood Component Type RED CELLS,LR    Unit division 00    Status of Unit ISSUED    Transfusion Status OK TO TRANSFUSE    Crossmatch Result      Compatible Performed at [facility], [address] [place], [place] [address]   BPAM [facility]   Collection Time: 11/27/18  9:22 AM  Result Value Ref Range   ISSUE DATE / TIME [id number]    Blood Product Unit Number [id number]    PRODUCT CODE [id number]    Unit Type and Rh 6200    Blood Product Expiration Date [id number]   Hemoglobin and hematocrit, blood   Collection Time: 11/27/18  5:30 PM  Result Value Ref Range   Hemoglobin 9.5 (L) 12.0 - 15.0 g/dL   HCT 67.2 (L) 09.4 - 70.9 %     Constitutional: NAD, AAOx3  HE/ENT: extraocular movements grossly intact, moist mucous membranes CV: RRR PULM: nl  respiratory effort, CTABL     Abd: gravid, non-tender, non-distended, soft      Ext: Non-tender, Nonedematous   Psych: mood appropriate, speech normal Pelvic: deferred  Fetal  monitoring: Cat I Appropriate for GA Baseline: 135bpm Variability: moderate Accelerations:  present x >2 Decelerations absent Time  Toco: irregular q8-38min    A/P: 23 y.o. [redacted]w[redacted]d here for antenatal surveillance for anemia in pregnancy and blood transfusion  Principle Diagnosis:  Anemia in pregnancy   Labor: not present.   Fetal Wellbeing: Reassuring Cat 1 tracing with Reactive NST   S/p txfuse 1 unit PRBCs; Hgb at 9.5mg /dL after transfusion.   Iron with Vit C BID.   D/c home stable, precautions reviewed, follow-up as scheduled.    Randa Ngo, CNM 11/27/2018  5:54 PM

## 2018-11-27 NOTE — OB Triage Note (Signed)
Here for blood transfusion. Elaina Hoops

## 2018-11-28 LAB — TYPE AND SCREEN
ABO/RH(D): A POS
Antibody Screen: NEGATIVE
Unit division: 0

## 2018-11-28 LAB — BPAM RBC
BLOOD PRODUCT EXPIRATION DATE: 202003192359
ISSUE DATE / TIME: 202003011143
Unit Type and Rh: 6200

## 2018-12-06 ENCOUNTER — Other Ambulatory Visit: Payer: Self-pay

## 2018-12-06 ENCOUNTER — Inpatient Hospital Stay
Admission: EM | Admit: 2018-12-06 | Discharge: 2018-12-08 | DRG: 806 | Disposition: A | Discharge status: Home / Self Care | Payer: Medicaid Other | Attending: Obstetrics and Gynecology | Admitting: Obstetrics and Gynecology

## 2018-12-06 ENCOUNTER — Inpatient Hospital Stay: Payer: Medicaid Other | Admitting: Anesthesiology

## 2018-12-06 DIAGNOSIS — F1210 Cannabis abuse, uncomplicated: Secondary | ICD-10-CM | POA: Diagnosis present

## 2018-12-06 DIAGNOSIS — O99324 Drug use complicating childbirth: Secondary | ICD-10-CM | POA: Diagnosis present

## 2018-12-06 DIAGNOSIS — O99824 Streptococcus B carrier state complicating childbirth: Principal | ICD-10-CM | POA: Diagnosis present

## 2018-12-06 DIAGNOSIS — O9081 Anemia of the puerperium: Secondary | ICD-10-CM | POA: Diagnosis not present

## 2018-12-06 DIAGNOSIS — D62 Acute posthemorrhagic anemia: Secondary | ICD-10-CM | POA: Diagnosis not present

## 2018-12-06 DIAGNOSIS — O479 False labor, unspecified: Secondary | ICD-10-CM | POA: Diagnosis present

## 2018-12-06 DIAGNOSIS — Z3A38 38 weeks gestation of pregnancy: Secondary | ICD-10-CM

## 2018-12-06 DIAGNOSIS — O26893 Other specified pregnancy related conditions, third trimester: Secondary | ICD-10-CM | POA: Diagnosis present

## 2018-12-06 LAB — URINE DRUG SCREEN, QUALITATIVE (ARMC ONLY)
Amphetamines, Ur Screen: NOT DETECTED
Barbiturates, Ur Screen: NOT DETECTED
Benzodiazepine, Ur Scrn: NOT DETECTED
Cannabinoid 50 Ng, Ur ~~LOC~~: NOT DETECTED
Cocaine Metabolite,Ur ~~LOC~~: NOT DETECTED
MDMA (Ecstasy)Ur Screen: NOT DETECTED
Methadone Scn, Ur: NOT DETECTED
Opiate, Ur Screen: NOT DETECTED
PHENCYCLIDINE (PCP) UR S: NOT DETECTED
Tricyclic, Ur Screen: NOT DETECTED

## 2018-12-06 LAB — CBC
HCT: 31.6 % — ABNORMAL LOW (ref 36.0–46.0)
Hemoglobin: 10 g/dL — ABNORMAL LOW (ref 12.0–15.0)
MCH: 29 pg (ref 26.0–34.0)
MCHC: 31.6 g/dL (ref 30.0–36.0)
MCV: 91.6 fL (ref 80.0–100.0)
Platelets: 161 10*3/uL (ref 150–400)
RBC: 3.45 MIL/uL — ABNORMAL LOW (ref 3.87–5.11)
RDW: 14.9 % (ref 11.5–15.5)
WBC: 8.6 10*3/uL (ref 4.0–10.5)
nRBC: 0 % (ref 0.0–0.2)

## 2018-12-06 LAB — TYPE AND SCREEN
ABO/RH(D): A POS
Antibody Screen: NEGATIVE

## 2018-12-06 MED ORDER — LIDOCAINE HCL (PF) 1 % IJ SOLN
INTRAMUSCULAR | Status: AC
  Filled 2018-12-06: qty 30

## 2018-12-06 MED ORDER — OXYTOCIN BOLUS FROM INFUSION
500.0000 mL | Freq: Once | INTRAVENOUS | Status: AC
  Administered 2018-12-06: 500 mL via INTRAVENOUS

## 2018-12-06 MED ORDER — OXYCODONE-ACETAMINOPHEN 5-325 MG PO TABS: 1.0000 | ORAL_TABLET | ORAL | Status: DC | PRN

## 2018-12-06 MED ORDER — AMMONIA AROMATIC IN INHA
RESPIRATORY_TRACT | Status: AC
  Filled 2018-12-06: qty 10

## 2018-12-06 MED ORDER — EPHEDRINE 5 MG/ML INJ
10.0000 mg | INTRAVENOUS | Status: DC | PRN
  Filled 2018-12-06: qty 2

## 2018-12-06 MED ORDER — DIPHENHYDRAMINE HCL 50 MG/ML IJ SOLN: 12.5000 mg | INTRAMUSCULAR | Status: DC | PRN

## 2018-12-06 MED ORDER — OXYCODONE-ACETAMINOPHEN 5-325 MG PO TABS: 2.0000 | ORAL_TABLET | ORAL | Status: DC | PRN

## 2018-12-06 MED ORDER — PHENYLEPHRINE 40 MCG/ML (10ML) SYRINGE FOR IV PUSH (FOR BLOOD PRESSURE SUPPORT)
80.0000 ug | PREFILLED_SYRINGE | INTRAVENOUS | Status: DC | PRN
  Filled 2018-12-06: qty 10

## 2018-12-06 MED ORDER — TERBUTALINE SULFATE 1 MG/ML IJ SOLN: 0.2500 mg | Freq: Once | INTRAMUSCULAR | Status: DC | PRN

## 2018-12-06 MED ORDER — FENTANYL 2.5 MCG/ML W/ROPIVACAINE 0.15% IN NS 100 ML EPIDURAL (ARMC)
EPIDURAL | Status: DC | PRN
  Administered 2018-12-06: 12 mL/h via EPIDURAL

## 2018-12-06 MED ORDER — LACTATED RINGERS IV SOLN
INTRAVENOUS | Status: DC
  Administered 2018-12-06 (×2): via INTRAVENOUS

## 2018-12-06 MED ORDER — BUPIVACAINE HCL (PF) 0.25 % IJ SOLN
INTRAMUSCULAR | Status: DC | PRN
  Administered 2018-12-06: 5 mL via EPIDURAL
  Administered 2018-12-06: 3 mL via EPIDURAL

## 2018-12-06 MED ORDER — FENTANYL 2.5 MCG/ML W/ROPIVACAINE 0.15% IN NS 100 ML EPIDURAL (ARMC)
EPIDURAL | Status: AC
  Filled 2018-12-06: qty 100

## 2018-12-06 MED ORDER — BUTORPHANOL TARTRATE 1 MG/ML IJ SOLN
1.0000 mg | INTRAMUSCULAR | Status: DC | PRN
  Administered 2018-12-06: 1 mg via INTRAVENOUS
  Filled 2018-12-06: qty 1

## 2018-12-06 MED ORDER — LIDOCAINE-EPINEPHRINE (PF) 1.5 %-1:200000 IJ SOLN
INTRAMUSCULAR | Status: DC | PRN
  Administered 2018-12-06: 4 mL via EPIDURAL

## 2018-12-06 MED ORDER — SODIUM CHLORIDE 0.9 % IV SOLN
2.0000 g | INTRAVENOUS | Status: DC
  Administered 2018-12-06 (×2): 2 g via INTRAVENOUS
  Filled 2018-12-06 (×5): qty 2000

## 2018-12-06 MED ORDER — ONDANSETRON HCL 4 MG/2ML IJ SOLN: 4.0000 mg | Freq: Four times a day (QID) | INTRAMUSCULAR | Status: DC | PRN

## 2018-12-06 MED ORDER — LIDOCAINE HCL (PF) 1 % IJ SOLN
INTRAMUSCULAR | Status: DC | PRN
  Administered 2018-12-06: 4 mL via INTRADERMAL

## 2018-12-06 MED ORDER — OXYTOCIN 40 UNITS IN NORMAL SALINE INFUSION - SIMPLE MED
1.0000 m[IU]/min | INTRAVENOUS | Status: DC
  Administered 2018-12-06: 4 m[IU]/min via INTRAVENOUS
  Filled 2018-12-06: qty 1000

## 2018-12-06 MED ORDER — LACTATED RINGERS IV SOLN: 500.0000 mL | INTRAVENOUS | Status: DC | PRN

## 2018-12-06 MED ORDER — OXYTOCIN 10 UNIT/ML IJ SOLN
INTRAMUSCULAR | Status: AC
  Filled 2018-12-06: qty 2

## 2018-12-06 MED ORDER — FENTANYL-BUPIVACAINE-NACL 0.5-0.125-0.9 MG/250ML-% EP SOLN
12.0000 mL/h | EPIDURAL | Status: DC | PRN
  Filled 2018-12-06: qty 250

## 2018-12-06 MED ORDER — MISOPROSTOL 200 MCG PO TABS
ORAL_TABLET | ORAL | Status: AC
  Filled 2018-12-06: qty 4

## 2018-12-06 MED ORDER — SOD CITRATE-CITRIC ACID 500-334 MG/5ML PO SOLN: 30.0000 mL | ORAL | Status: DC | PRN

## 2018-12-06 MED ORDER — ACETAMINOPHEN 325 MG PO TABS: 650.0000 mg | ORAL_TABLET | ORAL | Status: DC | PRN

## 2018-12-06 MED ORDER — SODIUM CHLORIDE 0.9 % IV SOLN
INTRAVENOUS | Status: AC
  Filled 2018-12-06: qty 1000

## 2018-12-06 MED ORDER — LIDOCAINE HCL (PF) 1 % IJ SOLN: 30.0000 mL | INTRAMUSCULAR | Status: DC | PRN

## 2018-12-06 MED ORDER — LACTATED RINGERS IV SOLN: 500.0000 mL | Freq: Once | INTRAVENOUS | Status: DC

## 2018-12-06 MED ORDER — OXYTOCIN 40 UNITS IN NORMAL SALINE INFUSION - SIMPLE MED: 2.5000 [IU]/h | INTRAVENOUS | Status: DC

## 2018-12-06 NOTE — H&P (Signed)
Kristy [name] is a 23 y.o. female presenting for uterine ctx. No LOF , no vagina; bleeding  OB History    Gravida  4   Para  2   Term  2   Preterm  0   AB  1   Living  2     SAB  0   TAB  0   Ectopic  0   Multiple  0   Live Births  2          Past Medical History:  Diagnosis Date  . Anemia affecting pregnancy   . Cholelithiasis   . Marijuana abuse    Past Surgical History:  Procedure Laterality Date  . CHOLECYSTECTOMY, LAPAROSCOPIC  Feb 2016   Family History: family history is not on file. Social History:  reports that she has never smoked. She has never used smokeless tobacco. She reports previous drug use. Drug: Marijuana. She reports that she does not drink alcohol.     Maternal Diabetes: No Genetic Screening: Declined Maternal Ultrasounds/Referrals: Normal Fetal Ultrasounds or other Referrals:  None Maternal Substance Abuse:  Yes:  Type: Marijuana Significant Maternal Medications:  None Significant Maternal Lab Results:  None Other Comments:  None  ROS  Review of Systems: A full review of systems was performed and negative except as noted in the HPI.   Eyes: no vision change  Ears: left ear pain  Oropharynx: no sore throat  Pulmonary . No shortness of breath , no hemoptysis Cardiovascular: no chest pain , no irregular heart beat  Gastrointestinal:no blood in stool . No diarrhea, no constipation Uro gynecologic: no dysuria , no pelvic pain Neurologic : no seizure , no migraines    Musculoskeletal: no muscular weakness History Dilation: 3.5 Effacement (%): 80 Station: -2 Exam by:: Paige Monarrez MD Pulse 92, temperature 98.4 F (36.9 C), temperature source Oral, resp. rate 18, height 5\' 1"  (1.549 m), weight 75.3 kg. Exam Physical Exam  Lungs CTA  cv RRR abd : gravid   Cx exam as above - vtx   NST : fhr 120 + accels + variable decels no decels . CTX q3-4  Prenatal labs: ABO, Rh: --/--/A POS (03/01 7793) Antibody: NEG (03/01  9030) Rubella:  IMM / Varicella IMM RPR:   nr HBsAg:   neg HIV:   neg  GBS:   Positive  Assessment/Plan: Labor    Reassuring fetal monitoring  Start Ampicillin prophylaxis  CLE requested . UDS given MJ use  Start Pitocin in 2 hours   Kristy [name] 12/06/2018, 1:15 PM

## 2018-12-06 NOTE — Anesthesia Preprocedure Evaluation (Signed)
Anesthesia Evaluation  Patient identified by MRN, date of birth, ID band Patient awake    Reviewed: Allergy & Precautions, H&P , NPO status , Patient's Chart, lab work & pertinent test results, reviewed documented beta blocker date and time   Airway Mallampati: II  TM Distance: >3 FB Neck ROM: full    Dental no notable dental hx. (+) Teeth Intact   Pulmonary neg pulmonary ROS, Current Smoker,    Pulmonary exam normal breath sounds clear to auscultation       Cardiovascular Exercise Tolerance: Good negative cardio ROS   Rhythm:regular Rate:Normal     Neuro/Psych negative neurological ROS  negative psych ROS   GI/Hepatic negative GI ROS, Neg liver ROS,   Endo/Other  negative endocrine ROSdiabetes  Renal/GU      Musculoskeletal   Abdominal   Peds  Hematology negative hematology ROS (+) Blood dyscrasia, anemia ,   Anesthesia Other Findings   Reproductive/Obstetrics (+) Pregnancy                             Anesthesia Physical Anesthesia Plan  ASA: II  Anesthesia Plan: Epidural   Post-op Pain Management:    Induction:   PONV Risk Score and Plan:   Airway Management Planned:   Additional Equipment:   Intra-op Plan:   Post-operative Plan:   Informed Consent: I have reviewed the patients History and Physical, chart, labs and discussed the procedure including the risks, benefits and alternatives for the proposed anesthesia with the patient or authorized representative who has indicated his/her understanding and acceptance.       Plan Discussed with:   Anesthesia Plan Comments:         Anesthesia Quick Evaluation

## 2018-12-06 NOTE — Discharge Summary (Signed)
Obstetrical Discharge Summary  Patient Name: Kristy [name] DOB: 11-19-1995 MRN: [id number]  Date of Admission: 12/06/2018 Date of Delivery: 12/06/2018 Delivered by: schermerhorn  md Date of Discharge: 12/08/2018 Primary OB: [facility]  LMP:No LMP recorded. EDC Estimated Date of Delivery: 12/14/18 Gestational Age at Delivery: [redacted]w[redacted]d   Antepartum complications:MJ use, anemia, chlamydia Admitting Diagnosis: labor Secondary Diagnosis: Patient Active Problem List   Diagnosis Date Noted  . Uterine contractions during pregnancy 12/06/2018  . Anemia affecting pregnancy in third trimester 11/27/2018  . Postpartum care following vaginal delivery 03/29/2016    Augmentation: Pitocin Complications: None Intrapartum complications/course:  Date of Delivery: 12/06/18 Delivered By: schermerhorn md Delivery Type: spontaneous vaginal delivery Anesthesia: epidural Placenta: spontaneous Laceration: none Episiotomy: none Newborn Data: Live born female  Birth Weight:  6#14 APGAR: 8, 9  Newborn Delivery   Birth date/time:  12/06/2018 21:57:00 Delivery type:  Vaginal, Spontaneous       Postpartum Procedures: none  Post partum course:  Patient had an uncomplicated postpartum course.  By time of discharge on PPD#2, her pain was controlled on oral pain medications; she had appropriate lochia and was ambulating, voiding without difficulty and tolerating regular diet.  She was deemed stable for discharge to home.       Discharge Physical Exam:  BP 111/78 (BP Location: Left Arm)   Pulse 70   Temp 98.3 F (36.8 C) (Oral)   Resp 16   Ht 5\' 1"  (1.549 m)   Wt 75.3 kg   SpO2 99%   Breastfeeding Unknown   BMI 31.37 kg/m   General: NAD CV: RRR Pulm: CTABL, nl effort ABD: s/nd/nt, fundus firm and below the umbilicus Lochia: small Perineum: intact DVT Evaluation: LE non-ttp, no evidence of DVT on exam.  Hemoglobin  Date Value Ref Range Status  12/07/2018 9.8 (L) 12.0 - 15.0  g/dL Final   HGB  Date Value Ref Range Status  10/12/2014 11.7 (L) 12.0 - 16.0 g/dL Final   HCT  Date Value Ref Range Status  12/07/2018 30.6 (L) 36.0 - 46.0 % Final  10/12/2014 37.1 35.0 - 47.0 % Final     Disposition: stable, discharge to home. Baby Feeding: formula Baby Disposition: home with mom  Rh Immune globulin given: n/a Rubella vaccine given: immune Tdap vaccine given in AP or PP setting: 10/14/18 given Flu vaccine given in AP or PP setting: declined  Contraception: Nexplanon  Prenatal Labs:  A+  ab screen neg   RI/VI  hepb neg  RPR  NR  HIV neg  GBS +     Plan:  Kristy [name] was discharged to home in good condition. Follow-up appointment with delivering provider in 6 weeks.  Discharge Medications: Allergies as of 12/08/2018   No Known Allergies     Medication List    TAKE these medications   acetaminophen 325 MG tablet Commonly known as:  Tylenol Take 2 tablets (650 mg total) by mouth every 4 (four) hours as needed (for pain scale < 4).   ferrous sulfate 325 (65 FE) MG tablet Take 1 tablet (325 mg total) by mouth 2 (two) times daily with a meal. For anemia, take with Vitamin C   ibuprofen 600 MG tablet Commonly known as:  ADVIL,MOTRIN Take 1 tablet (600 mg total) by mouth every 6 (six) hours as needed for moderate pain.   vitamin C 250 MG tablet Commonly known as:  ASCORBIC ACID Take 1 tablet (250 mg total) by mouth daily.  Follow-up Information    Schermerhorn, Ihor Austin, MD Follow up in 6 week(s).   Specialty:  Obstetrics and Gynecology Why:  routine PP visit Contact information: [address] [place] [address] [phone]        Randa Ngo, CNM. Schedule an appointment as soon as possible for a visit in 2 week(s).   Specialty:  Obstetrics and Gynecology Why:  Nexplanon placement 2-3weeks Contact information: [address] [place] [address] [phone]            Signed: Randa Ngo, CNM 12/08/2018 12:06 PM

## 2018-12-06 NOTE — Progress Notes (Signed)
Pt. presented to L/D with reported contractions q29min since 1000 12/06/18. She reports no bleeding or LOF. Positive fetal movement. VSS. Will continue to monitor.

## 2018-12-06 NOTE — Progress Notes (Signed)
Kristy [name] is a 23 y.o. [id number] at [redacted]w[redacted]d by Subjective: Minimal ctx pain   Objective: Pulse 92   Temp 98.4 F (36.9 C) (Oral)   Resp 18   Ht 5\' 1"  (1.549 m)   Wt 75.3 kg   BMI 31.37 kg/m  No intake/output data recorded. No intake/output data recorded.  FHT:  FHR: 130 bpm, variability: minimal ,  accelerations:  Present,  decelerations:  Absent UC:   irregular, every 5-7 minutes SVE:   Dilation: 3.5 Effacement (%): 80 Station: -2 Exam by:: Aasir Daigler MD  Labs: Lab Results  Component Value Date   WBC 8.6 12/06/2018   HGB 10.0 (L) 12/06/2018   HCT 31.6 (L) 12/06/2018   MCV 91.6 12/06/2018   PLT 161 12/06/2018    Assessment / Plan: irregular ctx   Reassuring fetal monitoring   arom : clear   IUPC placed start pitocin and second dose abx Kristy [name] 12/06/2018, 6:31 PM

## 2018-12-06 NOTE — Anesthesia Procedure Notes (Signed)
Epidural Patient location during procedure: OB  Staffing Performed: anesthesiologist   Preanesthetic Checklist Completed: patient identified, site marked, surgical consent, pre-op evaluation, timeout performed, IV checked, risks and benefits discussed and monitors and equipment checked  Epidural Patient position: sitting Prep: Betadine Patient monitoring: heart rate, continuous pulse ox and blood pressure Approach: midline Location: L4-L5 Injection technique: LOR saline  Needle:  Needle type: Tuohy  Needle gauge: 17 G Needle length: 9 cm and 9 Needle insertion depth: 6 cm Catheter type: closed end flexible Catheter size: 19 Gauge Catheter at skin depth: 12 cm Test dose: negative and 1.5% lidocaine with Epi 1:200 K  Assessment Sensory level: T10 Events: blood not aspirated, injection not painful, no injection resistance, negative IV test and no paresthesia  Additional Notes   Patient tolerated the insertion well without complications.-SATD -IVTD. No paresthesia. Refer to [facility] nursing for VS and dosingReason for block:procedure for pain     

## 2018-12-07 LAB — CBC
HCT: 30.6 % — ABNORMAL LOW (ref 36.0–46.0)
Hemoglobin: 9.8 g/dL — ABNORMAL LOW (ref 12.0–15.0)
MCH: 29.3 pg (ref 26.0–34.0)
MCHC: 32 g/dL (ref 30.0–36.0)
MCV: 91.6 fL (ref 80.0–100.0)
Platelets: 151 10*3/uL (ref 150–400)
RBC: 3.34 MIL/uL — ABNORMAL LOW (ref 3.87–5.11)
RDW: 14.8 % (ref 11.5–15.5)
WBC: 10.4 10*3/uL (ref 4.0–10.5)
nRBC: 0 % (ref 0.0–0.2)

## 2018-12-07 LAB — CHLAMYDIA/NGC RT PCR (ARMC ONLY)
CHLAMYDIA TR: NOT DETECTED
N gonorrhoeae: NOT DETECTED

## 2018-12-07 LAB — RPR: RPR Ser Ql: NONREACTIVE

## 2018-12-07 MED ORDER — FERROUS SULFATE 325 (65 FE) MG PO TABS
325.0000 mg | ORAL_TABLET | Freq: Two times a day (BID) | ORAL | Status: DC
  Administered 2018-12-07 – 2018-12-08 (×3): 325 mg via ORAL
  Filled 2018-12-07 (×3): qty 1

## 2018-12-07 MED ORDER — ONDANSETRON HCL 4 MG PO TABS: 4.0000 mg | ORAL_TABLET | ORAL | Status: DC | PRN

## 2018-12-07 MED ORDER — BENZOCAINE-MENTHOL 20-0.5 % EX AERO
1.0000 "application " | INHALATION_SPRAY | CUTANEOUS | Status: DC | PRN
  Administered 2018-12-07: 1 via TOPICAL
  Filled 2018-12-07: qty 56

## 2018-12-07 MED ORDER — ONDANSETRON HCL 4 MG/2ML IJ SOLN: 4.0000 mg | INTRAMUSCULAR | Status: DC | PRN

## 2018-12-07 MED ORDER — SIMETHICONE 80 MG PO CHEW: 80.0000 mg | CHEWABLE_TABLET | ORAL | Status: DC | PRN

## 2018-12-07 MED ORDER — PRENATAL MULTIVITAMIN CH
1.0000 | ORAL_TABLET | Freq: Every day | ORAL | Status: DC
  Administered 2018-12-07 – 2018-12-08 (×2): 1 via ORAL
  Filled 2018-12-07 (×2): qty 1

## 2018-12-07 MED ORDER — IBUPROFEN 600 MG PO TABS
600.0000 mg | ORAL_TABLET | Freq: Four times a day (QID) | ORAL | Status: DC
  Administered 2018-12-07 – 2018-12-08 (×5): 600 mg via ORAL
  Filled 2018-12-07 (×5): qty 1

## 2018-12-07 MED ORDER — ZOLPIDEM TARTRATE 5 MG PO TABS: 5.0000 mg | ORAL_TABLET | Freq: Every evening | ORAL | Status: DC | PRN

## 2018-12-07 MED ORDER — DIPHENHYDRAMINE HCL 25 MG PO CAPS: 25.0000 mg | ORAL_CAPSULE | Freq: Four times a day (QID) | ORAL | Status: DC | PRN

## 2018-12-07 MED ORDER — WITCH HAZEL-GLYCERIN EX PADS: 1.0000 "application " | MEDICATED_PAD | CUTANEOUS | Status: DC | PRN

## 2018-12-07 MED ORDER — MAGNESIUM HYDROXIDE 400 MG/5ML PO SUSP: 30.0000 mL | ORAL | Status: DC | PRN

## 2018-12-07 MED ORDER — ACETAMINOPHEN 325 MG PO TABS: 650.0000 mg | ORAL_TABLET | ORAL | Status: DC | PRN

## 2018-12-07 MED ORDER — HYDROCODONE-ACETAMINOPHEN 5-325 MG PO TABS: 1.0000 | ORAL_TABLET | ORAL | Status: DC | PRN

## 2018-12-07 MED ORDER — DIBUCAINE 1 % RE OINT: 1.0000 "application " | TOPICAL_OINTMENT | RECTAL | Status: DC | PRN

## 2018-12-07 MED ORDER — COCONUT OIL OIL: 1.0000 "application " | TOPICAL_OIL | Status: DC | PRN

## 2018-12-07 MED ORDER — SENNOSIDES-DOCUSATE SODIUM 8.6-50 MG PO TABS
2.0000 | ORAL_TABLET | ORAL | Status: DC
  Administered 2018-12-07 – 2018-12-08 (×2): 2 via ORAL
  Filled 2018-12-07 (×2): qty 2

## 2018-12-07 MED ORDER — IBUPROFEN 600 MG PO TABS
600.0000 mg | ORAL_TABLET | Freq: Four times a day (QID) | ORAL | Status: DC
  Administered 2018-12-07: 600 mg via ORAL
  Filled 2018-12-07: qty 1

## 2018-12-07 MED ORDER — MEASLES, MUMPS & RUBELLA VAC IJ SOLR
0.5000 mL | Freq: Once | INTRAMUSCULAR | Status: DC
  Filled 2018-12-07: qty 0.5

## 2018-12-07 NOTE — Anesthesia Postprocedure Evaluation (Signed)
Anesthesia Post Note  Patient: Kristy [name]  Procedure(s) Performed: AN AD HOC LABOR EPIDURAL  Patient location during evaluation: Mother Baby Anesthesia Type: Epidural Level of consciousness: oriented and awake and alert Pain management: pain level controlled Vital Signs Assessment: post-procedure vital signs reviewed and stable Respiratory status: spontaneous breathing and respiratory function stable Cardiovascular status: blood pressure returned to baseline and stable Postop Assessment: no headache, no backache, no apparent nausea or vomiting and able to ambulate Anesthetic complications: no     Last Vitals:  Vitals:   12/07/18 0207 12/07/18 0441  BP: 129/67 109/72  Pulse: 92 80  Resp: 16 18  Temp: 36.6 C 36.8 C  SpO2: 100% 99%    Last Pain:  Vitals:   12/07/18 0441  TempSrc: Oral  PainSc:                  Kristy [name]

## 2018-12-07 NOTE — Progress Notes (Signed)
Post Partum Day 1 Subjective: no complaints, up ad lib and voiding  Objective: Blood pressure 109/72, pulse 80, temperature 98.2 F (36.8 C), temperature source Oral, resp. rate 18, height 5\' 1"  (1.549 m), weight 75.3 kg, SpO2 99 %, unknown if currently breastfeeding.  Physical Exam:  General: alert, cooperative and appears stated age Lochia: appropriate Uterine Fundus: firm DVT Evaluation: No evidence of DVT seen on physical exam.  Recent Labs    12/06/18 1423 12/07/18 0455  HGB 10.0* 9.8*  HCT 31.6* 30.6*    Assessment/Plan: Plan for discharge tomorrow  Anemia: PO iron Hx of CT in this pregnancy: repeat test of cure   LOS: 1 day   Christeen Douglas 12/07/2018, 9:27 AM

## 2018-12-07 NOTE — Plan of Care (Signed)
Vs stable; pt up ad lib now; tolerating regular diet; taking motrin for pain control; formula feeding baby

## 2018-12-08 MED ORDER — IBUPROFEN 600 MG PO TABS: 600.0000 mg | ORAL_TABLET | Freq: Four times a day (QID) | ORAL | 0 refills | Status: DC | PRN

## 2018-12-08 MED ORDER — ACETAMINOPHEN 325 MG PO TABS: 650.0000 mg | ORAL_TABLET | ORAL | Status: DC | PRN

## 2018-12-08 NOTE — Progress Notes (Signed)
Post Partum Day 2 Subjective: Doing well, no complaints.  Tolerating regular diet, pain with PO meds, voiding and ambulating without difficulty.  No CP SOB Fever,Chills, N/V or leg pain; denies nipple or breast pain, no HA change of vision, RUQ/epigastric pain  Objective: BP 111/78 (BP Location: Left Arm)   Pulse 70   Temp 98.3 F (36.8 C) (Oral)   Resp 16   Ht 5\' 1"  (1.549 m)   Wt 75.3 kg   SpO2 99%   Breastfeeding Unknown   BMI 31.37 kg/m    Physical Exam:  General: NAD Breasts: soft/nontender CV: RRR Pulm: nl effort, CTABL Abdomen: soft, NT, BS x 4 Perineum: minimal edema, intact Lochia: small Uterine Fundus: fundus firm and 3 fb below umbilicus DVT Evaluation: no cords, ttp LEs   Recent Labs    12/06/18 1423 12/07/18 0455  HGB 10.0* 9.8*  HCT 31.6* 30.6*  WBC 8.6 10.4  PLT 161 151    Assessment/Plan: 23 y.o. [id number] postpartum day # 2  - Continue routine PP care - encouraged snug fitting bra and cabbage leaves for bottlefeeding.  - Discussed contraceptive options including implant, IUDs hormonal and non-hormonal, injection, pills/ring/patch, condoms, and NFP.  - Acute blood loss anemia - hemodynamically stable and asymptomatic; continue po ferrous sulfate BID with stool softeners  - Immunization status: all Imms up to date   Disposition: Does desire Dc home today.    Randa Ngo, CNM 12/08/2018  11:51 AM

## 2018-12-08 NOTE — Progress Notes (Signed)
Patient discharged home with infant. Discharge instructions and prescriptions given and reviewed with patient. Patient verbalized understanding. Escorted out by auxillary.  

## 2019-05-05 ENCOUNTER — Ambulatory Visit: Payer: Self-pay

## 2019-05-18 ENCOUNTER — Other Ambulatory Visit: Payer: Self-pay

## 2019-05-18 ENCOUNTER — Ambulatory Visit (LOCAL_COMMUNITY_HEALTH_CENTER): Payer: Medicaid Other | Admitting: Nurse Practitioner

## 2019-05-18 VITALS — BP 118/81 | Ht 61.0 in | Wt 165.2 lb

## 2019-05-18 DIAGNOSIS — Z3009 Encounter for other general counseling and advice on contraception: Secondary | ICD-10-CM | POA: Diagnosis not present

## 2019-05-18 DIAGNOSIS — Z3046 Encounter for surveillance of implantable subdermal contraceptive: Secondary | ICD-10-CM

## 2019-05-18 NOTE — Progress Notes (Signed)
Here today for Nexplanon removal. Does not desire alternate birth control at this time. Patient states last sex was this morning. RN counseled patient regarding the Nexplanon removal that if she has had sex within the past seven days there is a chance she may become pregnant due to sperm can live in the body for up to seven days. Patient states she "just wants to give her body a rest from birth control."  Hal Morales, RN

## 2019-05-18 NOTE — Progress Notes (Signed)
  Nexplanon Removal - from right arm  Patient identified, informed consent performed, consent signed.   Appropriate time out taken. Nexplanon site identified.  Area prepped in usual sterile fashon. 3 ml of 1% lidocaine with Epinephrine was used to anesthetize the area at the distal end of the implant and along implant site. A small stab incision was made right beside the implant on the distal portion.  The Nexplanon rod was grasped using straight hemostats and removed slight difficulty.  There was minimal blood loss. There were no complications.  Steri-strips were applied over the small incision.  A pressure bandage was applied to reduce any bruising.  The patient tolerated the procedure well and was given post procedure instructions.

## 2019-08-28 ENCOUNTER — Ambulatory Visit (LOCAL_COMMUNITY_HEALTH_CENTER): Payer: Medicaid Other | Admitting: Family Medicine

## 2019-08-28 ENCOUNTER — Encounter: Payer: Self-pay | Admitting: Family Medicine

## 2019-08-28 ENCOUNTER — Other Ambulatory Visit: Payer: Self-pay

## 2019-08-28 VITALS — BP 121/77 | Ht 61.0 in | Wt 158.0 lb

## 2019-08-28 DIAGNOSIS — Z113 Encounter for screening for infections with a predominantly sexual mode of transmission: Secondary | ICD-10-CM | POA: Diagnosis not present

## 2019-08-28 DIAGNOSIS — Z3009 Encounter for other general counseling and advice on contraception: Secondary | ICD-10-CM | POA: Diagnosis not present

## 2019-08-28 DIAGNOSIS — Z01419 Encounter for gynecological examination (general) (routine) without abnormal findings: Secondary | ICD-10-CM

## 2019-08-28 LAB — WET PREP FOR TRICH, YEAST, CLUE
Trichomonas Exam: NEGATIVE
Yeast Exam: NEGATIVE

## 2019-08-28 MED ORDER — NORETHINDRONE 0.35 MG PO TABS: 1.0000 | ORAL_TABLET | Freq: Every day | ORAL | 0 refills | Status: DC

## 2019-08-28 NOTE — Progress Notes (Signed)
Wet mount reviewed. No tx per SO. Patient given #1 Norethindrone and scheduled for Nexplanon insertion.  Consult complete Aileen Fass, RN

## 2019-08-28 NOTE — Progress Notes (Signed)
Family Planning Visit- Repeat Yearly Visit  Subjective:  Kristy [name] is a 23 y.o. being seen today for an well woman visit and to discuss family planning options.    She is currently using none for pregnancy prevention. Patient reports she does not if she or her partner wants a pregnancy in the next year. Patient  does not have any active problems on file.  Chief Complaint  Patient presents with  . Contraception    interested in the patch  . SEXUALLY TRANSMITTED DISEASE    STD screening    Patient reports she would like to discuss BCM. Currently using condoms sometimes.  Has had implant twice, last time only in for ~6 months, removed d/t feeling like it was poking her, felt too low in arm. Now she is interested in patch or nexplanon again.   Would also like STI testing. Patient denies any symptoms including vaginal discharge, abdominal pain, fever, n/v. Last sex: 11/24, no condoms. LMP: 11/8  Pap: due today  Does the patient desire a pregnancy in the next year? (OKQ flowsheet) No  See flowsheet for other program required questions.   Body mass index is 29.85 kg/m. - Patient is eligible for diabetes screening based on BMI and age >23?  no HA1C ordered? not applicable  Patient reports 2 of partners in last year. Desires STI screening?  Yes  Does the patient have a current or past history of drug use? Yes   No components found for: HCV]   Health Maintenance Due  Topic Date Due  . PAP-Cervical Cytology Screening  10/29/2015  . PAP SMEAR-Modifier  10/29/2015  . INFLUENZA VACCINE  04/29/2019    ROS  The following portions of the patient's history were reviewed and updated as appropriate: allergies, current medications, past family history, past medical history, past social history, past surgical history and problem list. Problem list updated.  Objective:   Vitals:   08/28/19 0901  BP: 121/77  Weight: 158 lb (71.7 kg)  Height: 5\' 1"  (1.549 m)    Physical  Exam Vitals signs and nursing note reviewed.  Constitutional:      Appearance: Normal appearance.  HENT:     Head: Normocephalic and atraumatic.     Mouth/Throat:     Mouth: Mucous membranes are moist.     Pharynx: Oropharynx is clear. No oropharyngeal exudate or posterior oropharyngeal erythema.  Pulmonary:     Effort: Pulmonary effort is normal.  Chest:     Breasts: Breasts are symmetrical.        Right: Normal. No mass, nipple discharge, skin change or tenderness.        Left: No mass, nipple discharge, skin change or tenderness.  Abdominal:     General: Abdomen is flat.     Palpations: There is no mass.     Tenderness: There is no abdominal tenderness. There is no rebound.  Genitourinary:    General: Normal vulva.     Exam position: Lithotomy position.     Pubic Area: No rash or pubic lice.      Labia:        Right: No rash or lesion.        Left: No rash or lesion.      Vagina: Normal. No vaginal discharge, erythema, bleeding or lesions.     Cervix: No cervical motion tenderness, discharge, friability, lesion or erythema.     Uterus: Normal.      Adnexa: Right adnexa normal and left adnexa normal.  Rectum: Normal.  Musculoskeletal: Normal range of motion.  Lymphadenopathy:     Head:     Right side of head: No preauricular or posterior auricular adenopathy.     Left side of head: No preauricular or posterior auricular adenopathy.     Cervical: No cervical adenopathy.     Upper Body:     Right upper body: No supraclavicular or axillary adenopathy.     Left upper body: No supraclavicular or axillary adenopathy.     Lower Body: No right inguinal adenopathy. No left inguinal adenopathy.  Skin:    General: Skin is warm and dry.     Findings: No rash.  Neurological:     Mental Status: She is alert and oriented to person, place, and time. Mental status is at baseline.       Assessment and Plan:  Kristy [name] is a 23 y.o. female presenting to the [facility] Department for an initial well woman exam/family planning visit  Contraception counseling: Reviewed all forms of birth control options available including abstinence; over the counter/barrier methods; hormonal contraceptive medication including pill, patch, ring, injection,contraceptive implant; hormonal and nonhormonal IUDs; permanent sterilization options including vasectomy and the various tubal sterilization modalities in the tiered based fashion. Risks and benefits reviewed.  Questions were answered.  Written information was also given to the patient to review.    Patient desires nexplanon re-insertion.  Patient was counseled on the side effect of the method chosen, the correct use of her desired method and how to discontinue in the future. She was told to call with any further questions, or with any concerns about this method of contraception.  Emphasized use of condoms 100% of the time for STI prevention.  1. Screening examination for venereal disease -Screenings today as below. Treat wet prep per standing order -Discussed that GC/CT NAAT may take >1 week to result. Counseled on warning s/sx and when to seek care. Recommended condom use with all sex. - Chlamydia/Gonorrhea [facility] - HIV [facility] - Syphilis Serology,  Lab - WET PREP FOR TRICH, YEAST, CLUE  2. General counseling and advice for contraceptive management -Discussed possibility of patch, but d/t hx of migraines with pregnancy and BMI of 29.85 we reviewed risks and she declines, would prefer to have Nexplanon reinserted.  -Pt to RTC on/after Dec 8 for Nexplanon insertion, which is 2 weeks post unprotected sex for pregnancy test and insertion.  -Norethindrone x1 pack given to start today for protection until this appointment. (POP d/t hx of migraines) Advised condoms with all sex. - norethindrone (MICRONOR) 0.35 MG tablet; Take 1 tablet (0.35 mg total) by mouth daily.  Dispense: 1 Package; Refill: 0  3.  Encounter for cervical Pap smear with pelvic exam Pap today. - Pap IG (Image Guided)     Return in about 1 week (around 09/04/2019) for NExplanon insertion.  Future Appointments  Date Time Provider Department Center  09/05/2019  8:20 AM [facility] PROVIDER [facility] None    Ann Held, PA-C

## 2019-08-28 NOTE — Progress Notes (Signed)
Pt here for STD screening and desires to get on BCM. Pt reports condom use sometimes and last sex was 08/22/2019 without condoms.Ronny Bacon, RN

## 2019-09-03 LAB — PAP IG (IMAGE GUIDED): PAP Smear Comment: 0

## 2019-09-05 ENCOUNTER — Ambulatory Visit: Payer: Self-pay

## 2019-09-05 ENCOUNTER — Ambulatory Visit (LOCAL_COMMUNITY_HEALTH_CENTER): Payer: Medicaid Other | Admitting: Family Medicine

## 2019-09-05 ENCOUNTER — Other Ambulatory Visit: Payer: Self-pay

## 2019-09-05 VITALS — BP 119/75 | Ht 62.0 in | Wt 159.0 lb

## 2019-09-05 DIAGNOSIS — Z3009 Encounter for other general counseling and advice on contraception: Secondary | ICD-10-CM | POA: Diagnosis not present

## 2019-09-05 DIAGNOSIS — Z30017 Encounter for initial prescription of implantable subdermal contraceptive: Secondary | ICD-10-CM | POA: Diagnosis not present

## 2019-09-05 MED ORDER — ETONOGESTREL 68 MG ~~LOC~~ IMPL
68.0000 mg | DRUG_IMPLANT | Freq: Once | SUBCUTANEOUS | Status: AC
  Administered 2019-09-05: 68 mg via SUBCUTANEOUS

## 2019-09-05 NOTE — Progress Notes (Signed)
Nexplanon Insertion Procedure Patient identified, informed consent performed, consent signed.   Patient does understand that irregular bleeding is a very common side effect of this medication. She was advised to have backup contraception after placement. Patient was determined to meet WHO criteria for not being pregnant. Appropriate time out taken.  The insertion site was identified 8-10 cm (3-4 inches) from the medial epicondyle of the humerus and 3-5 cm (1.25-2 inches) posterior to (below) the sulcus (groove) between the biceps and triceps muscles of the patient's Right arm and marked.  Patient was prepped with alcohol swab and then injected with 3 ml of 1% lidocaine.  Arm was prepped with chlorhexidene, Nexplanon removed from packaging,  Device confirmed in needle, then inserted full length of needle and withdrawn per handbook instructions. Nexplanon was able to palpated in the patient's arm; patient palpated the insert herself. There was minimal blood loss.  Patient insertion site covered with guaze and a pressure bandage to reduce any bruising.  The patient tolerated the procedure well and was given post procedure instructions.  Nexplanon:   Counseled patient to take OTC analgesic starting as soon as lidocaine starts to wear off and take regularly for at least 48 hr to decrease discomfort.  Specifically to take with food or milk to decrease stomach upset and for IB 600 mg (3 tablets) every 6 hrs; IB 800 mg (4 tablets) every 8 hrs; or Aleve 2 tablets every 12 hrs.   

## 2019-09-05 NOTE — Progress Notes (Signed)
Here today for a scheduled Nexplanon Insertion. Hal Morales, RN

## 2020-01-11 ENCOUNTER — Other Ambulatory Visit: Payer: Self-pay

## 2020-01-11 ENCOUNTER — Encounter: Payer: Self-pay | Admitting: Physician Assistant

## 2020-01-11 ENCOUNTER — Ambulatory Visit: Payer: Medicaid Other | Admitting: Physician Assistant

## 2020-01-11 DIAGNOSIS — Z113 Encounter for screening for infections with a predominantly sexual mode of transmission: Secondary | ICD-10-CM

## 2020-01-11 LAB — WET PREP FOR TRICH, YEAST, CLUE
Trichomonas Exam: NEGATIVE
Yeast Exam: NEGATIVE

## 2020-01-11 NOTE — Progress Notes (Signed)
  [facility] Department STI clinic/screening visit  Subjective:  Kristy [name] is a 24 y.o. female being seen today for an STI screening visit. The patient reports they do have symptoms.  Patient reports that they do not desire a pregnancy in the next year.   They reported they are not interested in discussing contraception today.  No LMP recorded. Patient has had an implant.   Patient has the following medical conditions:  There are no problems to display for this patient.   Chief Complaint  Patient presents with  . SEXUALLY TRANSMITTED DISEASE    24 yo here for STI eval, has had vaginal odor for 2 weeks.   Patient reports she is on Nexplanon, has irreg light periods.  See flowsheet for further details and programmatic requirements.    The following portions of the patient's history were reviewed and updated as appropriate: allergies, current medications, past medical history, past social history, past surgical history and problem list.  Objective:  There were no vitals filed for this visit.  Physical Exam Constitutional:      Appearance: Normal appearance.  Pulmonary:     Effort: Pulmonary effort is normal.  Abdominal:     Palpations: Abdomen is soft.     Tenderness: There is no abdominal tenderness.  Genitourinary:    Pubic Area: No rash.      Labia:        Right: No rash, tenderness or lesion.        Left: No rash, tenderness or lesion.      Urethra: No urethral pain, urethral swelling or urethral lesion.     Vagina: Vaginal discharge present. No tenderness, bleeding or lesions.     Cervix: No cervical motion tenderness, discharge, friability, lesion, erythema or cervical bleeding.     Uterus: Not tender.      Adnexa:        Right: No tenderness.         Left: No tenderness.       Rectum: No external hemorrhoid.     Comments: Sm amt adherent white vag discharge, pH <4.5 Musculoskeletal:     Cervical back: Normal range of motion.  Lymphadenopathy:      Cervical: No cervical adenopathy.     Lower Body: No right inguinal adenopathy. No left inguinal adenopathy.  Skin:    General: Skin is warm and dry.     Findings: No lesion or rash.  Neurological:     Mental Status: She is alert and oriented to person, place, and time.  Psychiatric:        Behavior: Behavior normal.        Thought Content: Thought content normal.        Judgment: Judgment normal.      Assessment and Plan:  YONEKO TALERICO is a 24 y.o. female presenting to the [facility] Department for STI screening  1. Routine screening for STI (sexually transmitted infection) Treat wet prep prn standing orders. Await GC, Chlam, HIV, Syphilis test results.     Return if symptoms worsen or fail to improve.  Future Appointments  Date Time Provider Department Center  01/11/2020  2:20 PM Akayla Brass, Mathis Dad, PA-C [facility]I None    Landry Dyke, [place]

## 2020-01-11 NOTE — Progress Notes (Signed)
In desiring STD screening; c/o discharge with mild odor x ~2 wks. Sharlette Dense, RN Wet prep reviewed-no Tx indicated Sharlette Dense, RN

## 2020-04-26 ENCOUNTER — Encounter: Payer: Self-pay | Admitting: *Deleted

## 2020-04-26 ENCOUNTER — Emergency Department
Admission: EM | Admit: 2020-04-26 | Discharge: 2020-04-26 | Disposition: A | Discharge status: Home / Self Care | Payer: Medicaid Other | Attending: Emergency Medicine | Admitting: Emergency Medicine

## 2020-04-26 ENCOUNTER — Other Ambulatory Visit: Payer: Self-pay

## 2020-04-26 ENCOUNTER — Emergency Department: Payer: Medicaid Other

## 2020-04-26 DIAGNOSIS — R42 Dizziness and giddiness: Secondary | ICD-10-CM | POA: Insufficient documentation

## 2020-04-26 DIAGNOSIS — R0789 Other chest pain: Secondary | ICD-10-CM | POA: Diagnosis present

## 2020-04-26 LAB — COMPREHENSIVE METABOLIC PANEL
ALT: 13 U/L (ref 0–44)
AST: 13 U/L — ABNORMAL LOW (ref 15–41)
Albumin: 4.2 g/dL (ref 3.5–5.0)
Alkaline Phosphatase: 52 U/L (ref 38–126)
Anion gap: 8 (ref 5–15)
BUN: 13 mg/dL (ref 6–20)
CO2: 28 mmol/L (ref 22–32)
Calcium: 9.1 mg/dL (ref 8.9–10.3)
Chloride: 107 mmol/L (ref 98–111)
Creatinine, Ser: 0.84 mg/dL (ref 0.44–1.00)
GFR calc Af Amer: >60 mL/min (ref >60)
GFR calc non Af Amer: >60 mL/min (ref >60)
Glucose, Bld: 110 mg/dL — ABNORMAL HIGH (ref 70–99)
Potassium: 4.3 mmol/L (ref 3.5–5.1)
Sodium: 143 mmol/L (ref 135–145)
Total Bilirubin: 0.8 mg/dL (ref 0.3–1.2)
Total Protein: 7 g/dL (ref 6.5–8.1)

## 2020-04-26 LAB — CBC
HCT: 39.6 % (ref 36.0–46.0)
Hemoglobin: 12.8 g/dL (ref 12.0–15.0)
MCH: 29.6 pg (ref 26.0–34.0)
MCHC: 32.3 g/dL (ref 30.0–36.0)
MCV: 91.7 fL (ref 80.0–100.0)
Platelets: 247 10*3/uL (ref 150–400)
RBC: 4.32 MIL/uL (ref 3.87–5.11)
RDW: 13 % (ref 11.5–15.5)
WBC: 5.7 10*3/uL (ref 4.0–10.5)
nRBC: 0 % (ref 0.0–0.2)

## 2020-04-26 LAB — POC URINE PREG, ED: Preg Test, Ur: NEGATIVE

## 2020-04-26 LAB — DIFFERENTIAL
Abs Immature Granulocytes: 0.04 10*3/uL (ref 0.00–0.07)
Basophils Absolute: 0 10*3/uL (ref 0.0–0.1)
Basophils Relative: 0 %
Eosinophils Absolute: 0.1 10*3/uL (ref 0.0–0.5)
Eosinophils Relative: 3 %
Immature Granulocytes: 1 %
Lymphocytes Relative: 46 %
Lymphs Abs: 2.7 10*3/uL (ref 0.7–4.0)
Monocytes Absolute: 0.4 10*3/uL (ref 0.1–1.0)
Monocytes Relative: 8 %
Neutro Abs: 2.4 10*3/uL (ref 1.7–7.7)
Neutrophils Relative %: 42 %

## 2020-04-26 LAB — TROPONIN I (HIGH SENSITIVITY): Troponin I (High Sensitivity): <2 ng/L (ref <18)

## 2020-04-26 MED ORDER — ONDANSETRON 4 MG PO TBDP
4.0000 mg | ORAL_TABLET | Freq: Once | ORAL | Status: AC
  Administered 2020-04-26: 4 mg via ORAL
  Filled 2020-04-26: qty 1

## 2020-04-26 NOTE — ED Provider Notes (Signed)
[facility] Emergency Department Provider Note   ____________________________________________   First MD Initiated Contact with Patient 04/26/20 1008     (approximate)  I have reviewed the triage vital signs and the nursing notes.   HISTORY  Chief Complaint Dizziness and Nausea    HPI Kristy [name] is a 24 y.o. female an episode of chest tightness occurring after getting up at about 11 PM to use the bathroom.  She was watching TV, smoking a joint which is normal for her in the evening.  She went to use the bathroom, after urinating became lightheaded, laid on the bed and felt a sense of lightheadedness and slight fuzziness of vision.  This is since gone away.  She felt tightness in her chest and was very anxious.  She reports this has happened to her in the past and she thinks she likely suffers from some undiagnosed anxiety at times  No history of heart disease.  Complete resolution of all chest pain discomfort at this time.  No abdominal pain.  Denies pregnancy.  Denies any sharp pain.  There was no shortness of breath.  Does have a history of migraines in the past   Past Medical History:  Diagnosis Date  . Anemia affecting pregnancy   . Cholelithiasis   . History of migraine during pregnancy   . Marijuana abuse     There are no problems to display for this patient.   Past Surgical History:  Procedure Laterality Date  . CHOLECYSTECTOMY    . CHOLECYSTECTOMY, LAPAROSCOPIC  Feb 2016    Prior to Admission medications   Medication Sig Start Date End Date Taking? Authorizing Provider  acetaminophen (TYLENOL) 325 MG tablet Take 2 tablets (650 mg total) by mouth every 4 (four) hours as needed (for pain scale < 4). Patient not taking: Reported on 08/28/2019 12/08/18   McVey, Prudencio Pair, CNM  ferrous sulfate 325 (65 FE) MG tablet Take 1 tablet (325 mg total) by mouth 2 (two) times daily with a meal. For anemia, take with Vitamin C Patient not taking:  Reported on 12/06/2018 11/27/18 01/26/19  McVey, Prudencio Pair, CNM  ibuprofen (ADVIL,MOTRIN) 600 MG tablet Take 1 tablet (600 mg total) by mouth every 6 (six) hours as needed for moderate pain. Patient not taking: Reported on 08/28/2019 12/08/18   McVey, Prudencio Pair, CNM  norethindrone (MICRONOR) 0.35 MG tablet Take 1 tablet (0.35 mg total) by mouth daily. Patient not taking: Reported on 09/05/2019 08/28/19   Samara Snide L, PA-C  vitamin C (ASCORBIC ACID) 250 MG tablet Take 1 tablet (250 mg total) by mouth daily. Patient not taking: Reported on 12/06/2018 11/27/18   McVey, Prudencio Pair, CNM    Allergies Patient has no known allergies.  Family History  Problem Relation Age of Onset  . Depression Maternal Grandmother   . Cancer Paternal Grandmother   . Cancer Paternal Grandfather     Social History Social History   Tobacco Use  . Smoking status: Never Smoker  . Smokeless tobacco: Never Used  Vaping Use  . Vaping Use: Never used  Substance Use Topics  . Alcohol use: No  . Drug use: Not Currently    Types: Marijuana    Comment: Last use october 2016    Review of Systems Constitutional: No fever/chills or recent illness.  She did get Materna vaccine on Wednesday reports she had some slight soreness in her shoulder after but that is gone away Eyes: No visual changes except her vision  seems just briefly little a little bit blurry while she felt lightheaded occurring after getting up from using the bathroom and transitioning to her bed. ENT: No sore throat. Cardiovascular: Denies chest pain. Respiratory: Denies shortness of breath. Gastrointestinal: No abdominal pain.  Did vomit once during the episode of pain Genitourinary: Negative for dysuria.  Denies pregnancy. Musculoskeletal: Negative for back pain. Skin: Negative for rash. Neurological: Negative for areas of focal weakness or numbness.  Had a mild to moderate headache which is gone away.  Headache was not severe, it was not maximal in  onset  Denies any history of blood clots.  No recent long trips or travel other than a trip to [facility] which was several months ago.  No leg swelling or calf pain.  Smokes marijuana but no cigarettes.  No recent surgeries or traumas.  ____________________________________________   PHYSICAL EXAM:  VITAL SIGNS: ED Triage Vitals  Enc Vitals Group     BP 04/26/20 0201 (!) 116/86     Pulse Rate 04/26/20 0201 72     Resp 04/26/20 0201 16     Temp 04/26/20 0201 98.5 F (36.9 C)     Temp Source 04/26/20 0201 Oral     SpO2 04/26/20 0201 99 %     Weight 04/26/20 0206 155 lb (70.3 kg)     Height 04/26/20 0206 5\' 1"  (1.549 m)     Head Circumference --      Peak Flow --      Pain Score 04/26/20 0205 7     Pain Loc --      Pain Edu? --      Excl. in GC? --     Constitutional: Alert and oriented. Well appearing and in no acute distress. Eyes: Conjunctivae are normal. Head: Atraumatic. Nose: No congestion/rhinnorhea. Mouth/Throat: Mucous membranes are moist. Neck: No stridor.  Cardiovascular: Normal rate, regular rhythm. Grossly normal heart sounds.  Good peripheral circulation. Respiratory: Normal respiratory effort.  No retractions. Lungs CTAB. Gastrointestinal: Soft and nontender. No distention. Musculoskeletal: No lower extremity tenderness nor edema. Neurologic:  Normal speech and language. No gross focal neurologic deficits are appreciated.  Skin:  Skin is warm, dry and intact. No rash noted. Psychiatric: Mood and affect are normal. Speech and behavior are normal.  ____________________________________________   LABS (all labs ordered are listed, but only abnormal results are displayed)  Labs Reviewed  COMPREHENSIVE METABOLIC PANEL - Abnormal; Notable for the following components:      Result Value   Glucose, Bld 110 (*)    AST 13 (*)    All other components within normal limits  CBC  DIFFERENTIAL  POC URINE PREG, ED  TROPONIN I (HIGH SENSITIVITY)    ____________________________________________  EKG  Reviewed interpreted at 10 AM, EKG initial but 2:10 AM Heart rate 80 QRS 89 QTc 400 Normal sinus rhythm, no evidence of acute ischemia. ____________________________________________  RADIOLOGY  CT HEAD WO CONTRAST  Result Date: 04/26/2020 CLINICAL DATA:  Dizziness EXAM: CT HEAD WITHOUT CONTRAST TECHNIQUE: Contiguous axial images were obtained from the base of the skull through the vertex without intravenous contrast. COMPARISON:  None. FINDINGS: Brain: No evidence of acute territorial infarction, hemorrhage, hydrocephalus,extra-axial collection or mass lesion/mass effect. Normal gray-white differentiation. Ventricles are normal in size and contour. Vascular: No hyperdense vessel or unexpected calcification. Skull: The skull is intact. No fracture or focal lesion identified. Sinuses/Orbits: The visualized paranasal sinuses and mastoid air cells are clear. The orbits and globes intact. Other: None IMPRESSION: No acute  intracranial abnormality. Electronically Signed   By: Jonna Clark M.D.   On: 04/26/2020 02:31    CT head no acute findings ____________________________________________   PROCEDURES  Procedure(s) performed: None  Procedures  Critical Care performed: No  ____________________________________________   INITIAL IMPRESSION / ASSESSMENT AND PLAN / ED COURSE  Pertinent labs & imaging results that were available during my care of the patient were reviewed by me and considered in my medical decision making (see chart for details).   Asymptomatic at the time my evaluation.  Feeling much improved.  Very reassuring clinical examination with normal physical exam.  Her EKG laboratory testing including CBC metabolic panel pregnancy test reassuring.  She feels much improved, has been able to ambulate in the ER without any ongoing chest pain or discomfort.  She does report she believes that a significant component of her chest  tightness was due to anxiety which she is experienced in the past in a similar fashion, I believe this is quite possible.  Discussed with her recommendation to follow-up and establish care with primary doctor obstetrician.  Also counseled on marijuana use and its risks.  No noted clinical history to support a suspicion for dissection, pulmonary embolism, ACS, infectious etiology.  Normal chest examination and auscultation with normal work of breathing.  No ongoing discomfort or pain  Return precautions and treatment recommendations and follow-up discussed with the patient who is agreeable with the plan.         ____________________________________________   FINAL CLINICAL IMPRESSION(S) / ED DIAGNOSES  Final diagnoses:  Atypical chest pain  Episodic lightheadedness        Note:  This document was prepared using Dragon voice recognition software and may include unintentional dictation errors       Sharyn Creamer, MD 04/26/20 1026

## 2020-04-26 NOTE — ED Triage Notes (Signed)
Pt to ED via EMS from home reporting she woke up to go to the bathroom and felt normal. Pt went to the bathroom and while walking back to the bed pt reports sudden onset of chest pain, dizziness and nausea. Pt reports she had a normal day yesterday.   No longer having chest pain but continues to have the dizziness with a headache. Nausea without vomiting. Pt received the first Maderna Shot on Wednesday without complications.   Vitals with EMS 138/88 72 NSR 99% RA

## 2020-04-26 NOTE — ED Notes (Signed)
E-signature not working at this time. Pt verbalized understanding of D/C instructions, prescriptions and follow up care with no further questions at this time. Pt in NAD and ambulatory at time of D/C.  

## 2020-04-26 NOTE — ED Notes (Signed)
Urine sent if needed °

## 2020-09-25 IMAGING — CT CT HEAD W/O CM
3 series · 16 of 45 positions shown, 19 images · non-contrast
Comparison: None.

CLINICAL DATA: Dizziness

EXAM:
CT HEAD WITHOUT CONTRAST
TECHNIQUE: Contiguous axial images were obtained from the base of the skull
through the vertex without intravenous contrast.

[Series 2: head wo · axial · 0.40mm/px · z∈[+167,+282]mm · 10 of 28 slices shown, 13 images]
[im 3/28  brain]
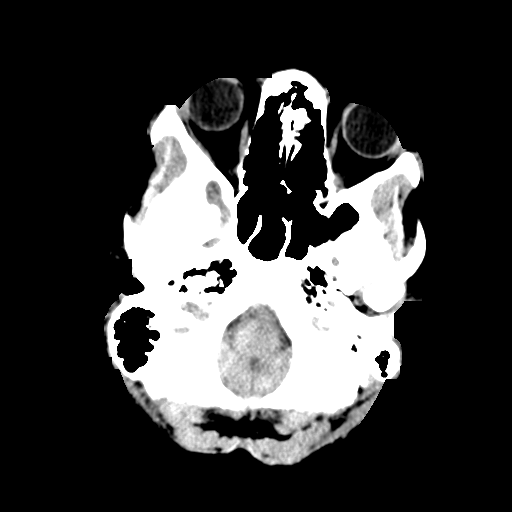
[im 3/28  bone]
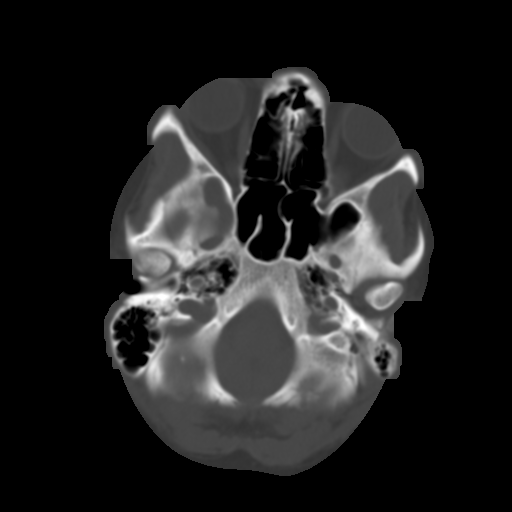
[im 5/28  brain]
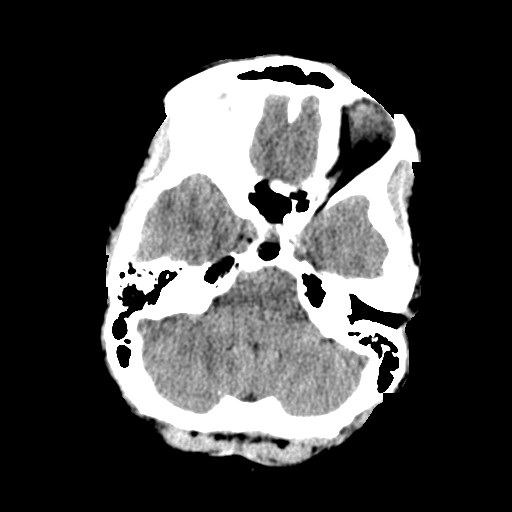
[im 8/28  brain]
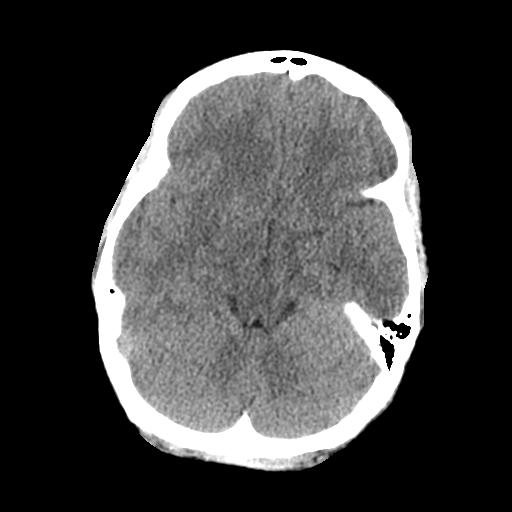
[im 11/28  brain]
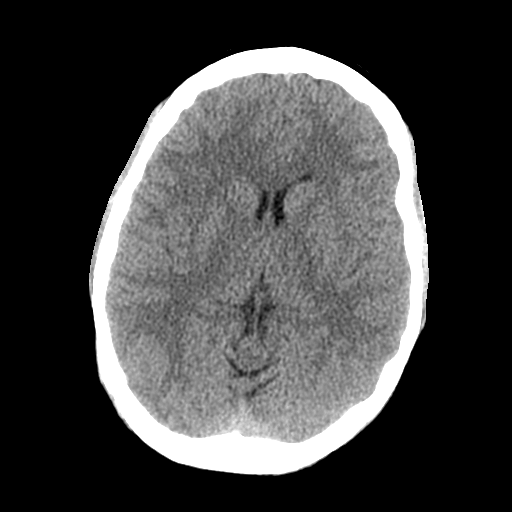
[im 13/28  brain]
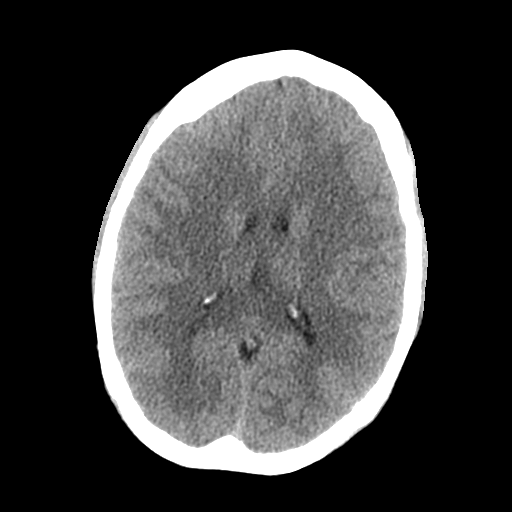
[im 13/28  bone]
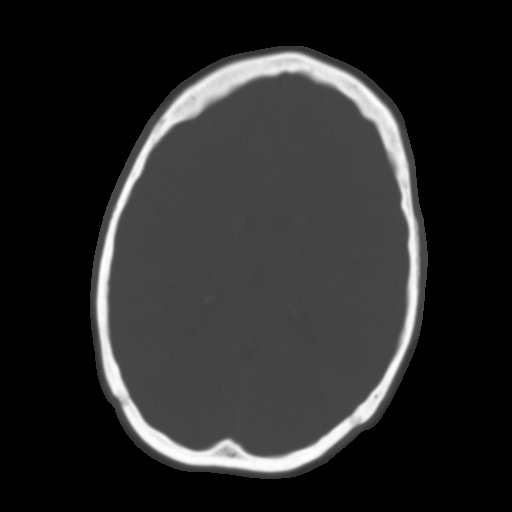
[im 16/28  brain]
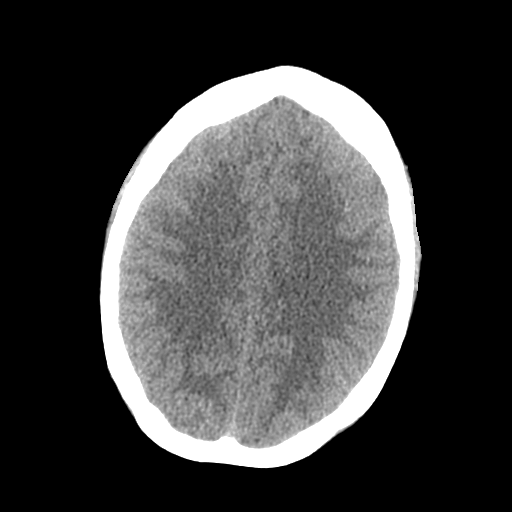
[im 18/28  brain]
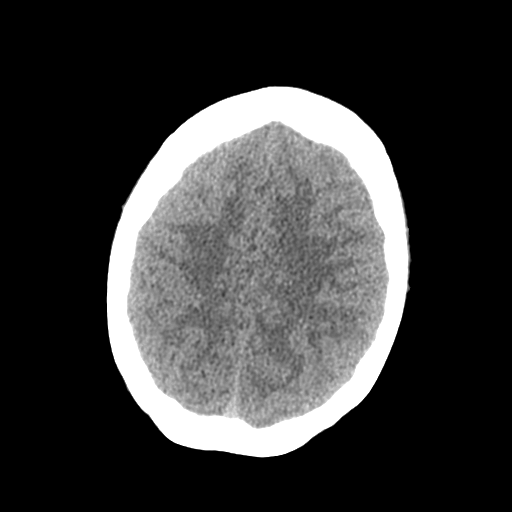
[im 21/28  brain]
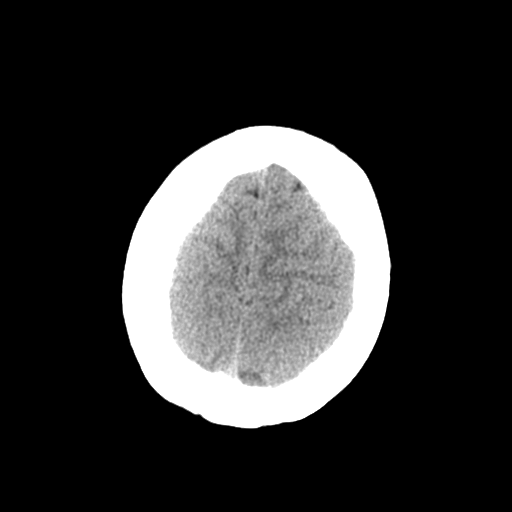
[im 24/28  brain]
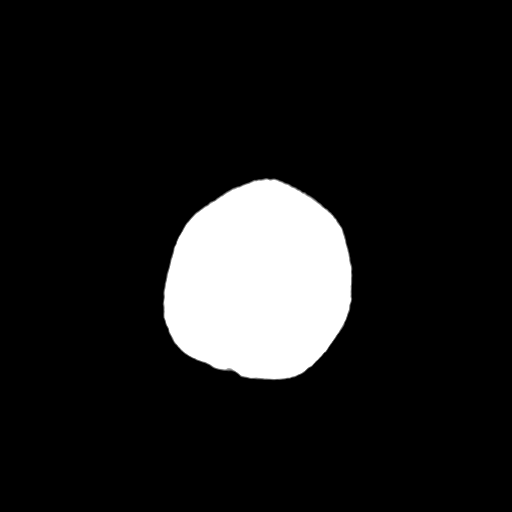
[im 24/28  bone]
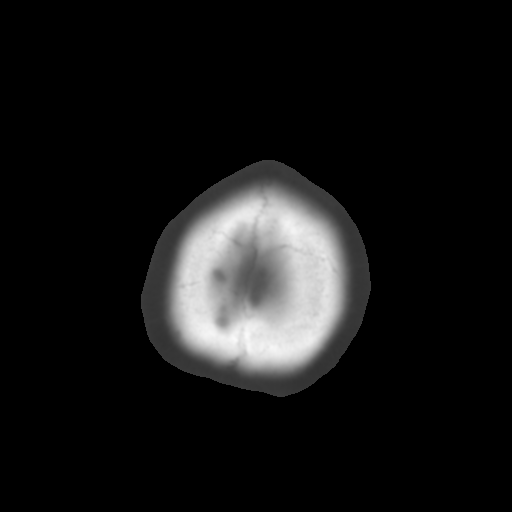
[im 26/28  brain]
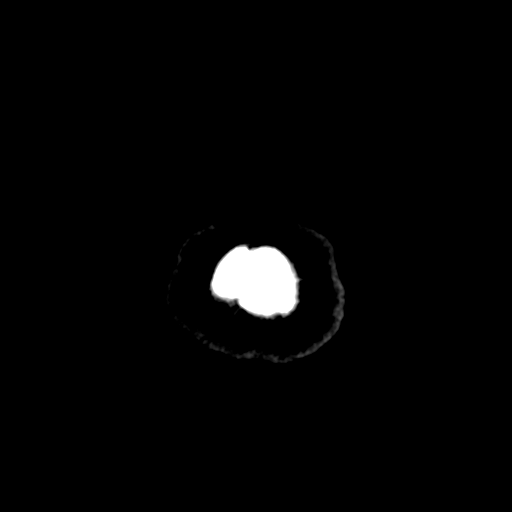

[Series 4: coronal soft tissue · coronal · 0.30mm/px · 3 of 60 slices shown]
[im 20/60  brain]
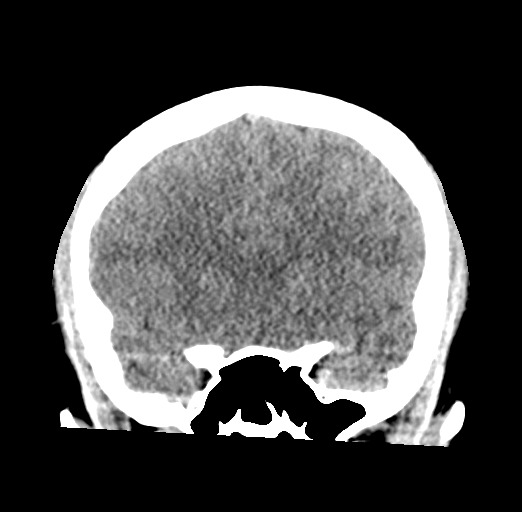
[im 27/60  brain]
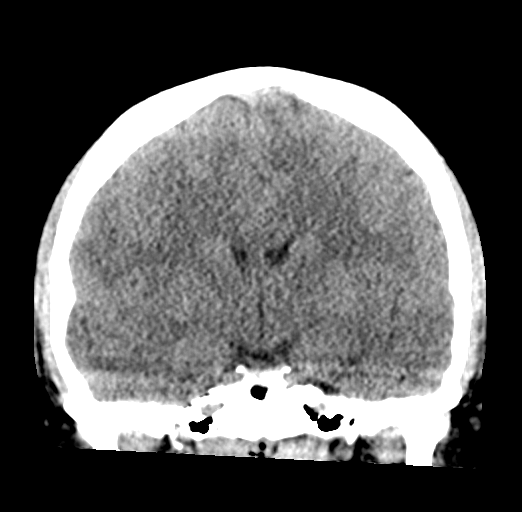
[im 33/60  brain]
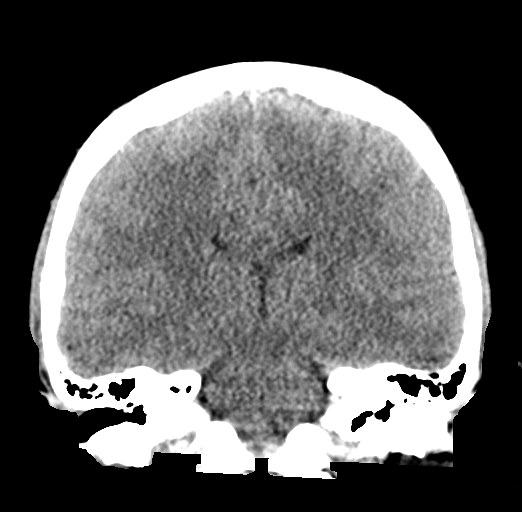

[Series 5: sagittal soft tissue · sagittal · 0.30mm/px · 3 of 46 slices shown]
[im 16/46  brain]
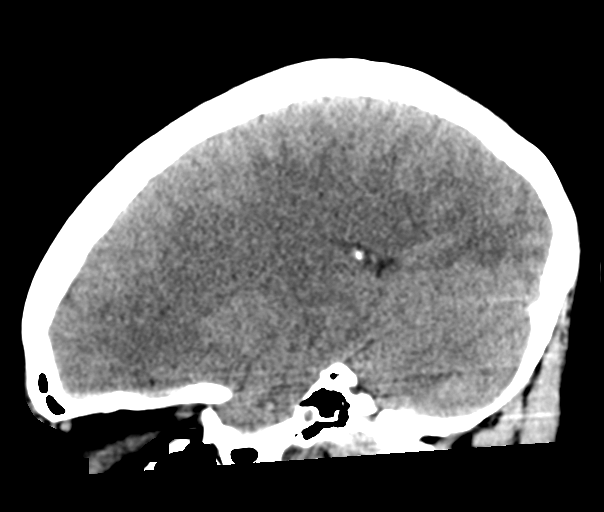
[im 23/46  brain]
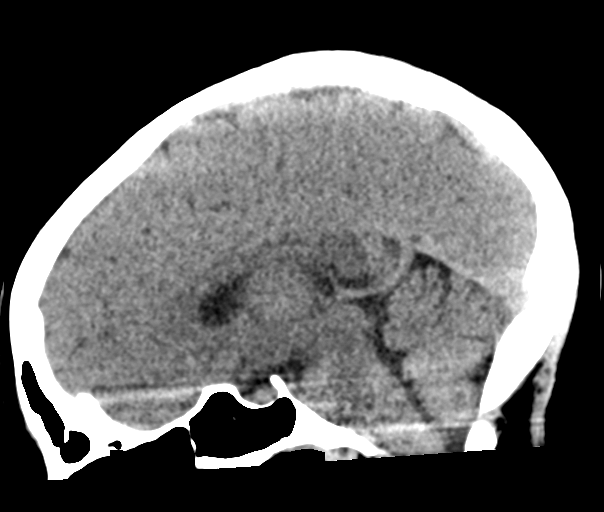
[im 31/46  brain]
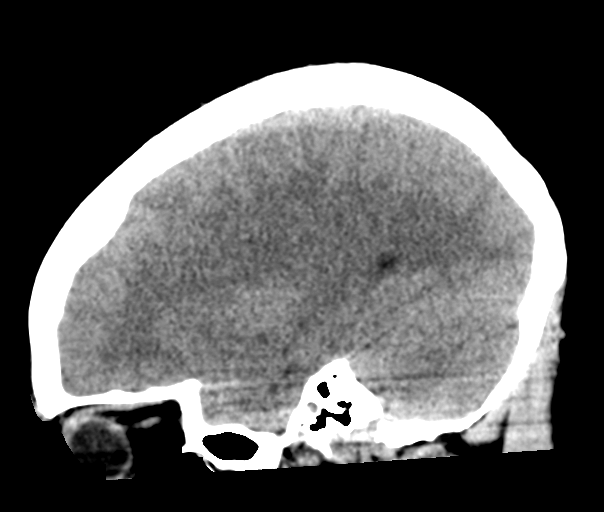

[16 of 45 positions shown; findings below may reference images not displayed]

FINDINGS: Brain: No evidence of acute territorial infarction, hemorrhage,
hydrocephalus,extra-axial collection or mass lesion/mass effect.
Normal gray-white differentiation. Ventricles are normal in size and
contour.

Vascular: No hyperdense vessel or unexpected calcification.

Skull: The skull is intact. No fracture or focal lesion identified.

Sinuses/Orbits: The visualized paranasal sinuses and mastoid air
cells are clear. The orbits and globes intact.

Other: None
IMPRESSION: No acute intracranial abnormality.

## 2020-11-27 ENCOUNTER — Ambulatory Visit: Payer: Medicaid Other

## 2020-11-27 ENCOUNTER — Ambulatory Visit (LOCAL_COMMUNITY_HEALTH_CENTER): Payer: Medicaid Other | Admitting: Advanced Practice Midwife

## 2020-11-27 ENCOUNTER — Encounter: Payer: Self-pay | Admitting: Advanced Practice Midwife

## 2020-11-27 ENCOUNTER — Other Ambulatory Visit: Payer: Self-pay

## 2020-11-27 VITALS — BP 117/73 | Ht 62.0 in | Wt 152.2 lb

## 2020-11-27 DIAGNOSIS — F1290 Cannabis use, unspecified, uncomplicated: Secondary | ICD-10-CM | POA: Insufficient documentation

## 2020-11-27 DIAGNOSIS — E663 Overweight: Secondary | ICD-10-CM

## 2020-11-27 DIAGNOSIS — Z3046 Encounter for surveillance of implantable subdermal contraceptive: Secondary | ICD-10-CM

## 2020-11-27 DIAGNOSIS — Z3009 Encounter for other general counseling and advice on contraception: Secondary | ICD-10-CM | POA: Diagnosis not present

## 2020-11-27 LAB — WET PREP FOR TRICH, YEAST, CLUE
Trichomonas Exam: NEGATIVE
Yeast Exam: NEGATIVE

## 2020-11-27 LAB — HEMOGLOBIN, FINGERSTICK: Hemoglobin: 13.3 g/dL (ref 11.1–15.9)

## 2020-11-27 NOTE — Progress Notes (Addendum)
[facility] [address] Main Number: [phone]    Family Planning Visit- Initial Visit  Subjective:  Kristy [name] is a 25 y.o. SBF [id number] (6,4,2) ex cigar smoker  being seen today for an initial well woman visit and to discuss family planning options.  She is currently using Nexplanon for pregnancy prevention. Patient reports she does not want a pregnancy in the next year.  Patient has the following medical conditions has Overweight BMI=27.8 and Marijuana use daily on their problem list.  Chief Complaint  Patient presents with  . Gynecologic Exam  . Procedure    Patient reports wants Nexplanon removed because she thinks it is making her feel anxious, depressed since 08/2020; also c/o poor appetite, +cry 1x/wk, poor sleep, -SI/HI, -moody, irritable.  LMP 09/2020.  Last PE 08/28/19.  Last pap 08/28/19 neg.  Last sex 11/08/20 without condom; with current partner off and on x 3 years; 1 partner in last 3 mo.  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  Living with her mom and stepdad, her 3 kids, brother, sister, stepdad's mom.  MJ daily.  Last cigar age 26.  Last ETOH 11/23/20 (1 cocktail) 1x/mo.  Nexplanon inserted 09/05/2019 Patient denies cigs, vaping  Body mass index is 27.84 kg/m. - Patient is eligible for diabetes screening based on BMI and age >59?  not applicable HA1C ordered? not applicable  Patient reports 1  partner/s in last year. Desires STI screening?  Yes  Has patient been screened once for HCV in the past?  No  No results found for: HCVAB  Does the patient have current drug use (including MJ), have a partner with drug use, and/or has been incarcerated since last result? Yes  If yes-- Screen for HCV through [facility]   Does the patient meet criteria for HBV testing? No  Criteria:  -Household, sexual or needle sharing contact with HBV -History of drug use -HIV positive -Those with known Hep  C   Health Maintenance Due  Topic Date Due  . Hepatitis C Screening  Never done  . COVID-19 Vaccine (1) Never done  . HPV VACCINES (1 - 2-dose series) Never done  . INFLUENZA VACCINE  Never done    Review of Systems  Cardiovascular: Positive for chest pain (chest tightness since 08/2020 when anxious x 30 min relieved with talking to someone, deep breaths).  Neurological: Positive for headaches (when she doesn't eat regularly 1x/wk, always between eyes and resolves with Advil).  All other systems reviewed and are negative.   The following portions of the patient's history were reviewed and updated as appropriate: allergies, current medications, past family history, past medical history, past social history, past surgical history and problem list. Problem list updated.   See flowsheet for other program required questions.  Objective:   Vitals:   11/27/20 0825  BP: 117/73  Weight: 152 lb 3.2 oz (69 kg)  Height: 5\' 2"  (1.575 m)    Physical Exam Constitutional:      Appearance: Normal appearance. She is normal weight.  HENT:     Head: Normocephalic and atraumatic.     Comments: No visible caries    Mouth/Throat:     Mouth: Mucous membranes are moist.  Eyes:     Conjunctiva/sclera: Conjunctivae normal.  Neck:     Comments: Thyroid without masses or enlargement Negative cervical lymphadenopathy Cardiovascular:     Rate and Rhythm: Normal rate and regular rhythm.  Pulmonary:     Effort: Pulmonary effort is normal.     Breath sounds: Normal breath sounds.  Chest:  Breasts:     Right: Normal.     Left: Normal.    Abdominal:     Palpations: Abdomen is soft.     Comments: Soft without masses or tenderness  Genitourinary:    General: Normal vulva.     Exam position: Lithotomy position.     Vagina: Vaginal discharge (white creamy leukorrhea, ph<4.5) present.     Cervix: Normal.     Uterus: Normal.      Adnexa: Right adnexa normal and left adnexa normal.     Rectum:  Normal.  Musculoskeletal:        General: Normal range of motion.     Cervical back: Normal range of motion and neck supple.  Skin:    General: Skin is warm and dry.  Neurological:     Mental Status: She is alert.  Psychiatric:        Mood and Affect: Mood normal.       Assessment and Plan:  Kristy [name] is a 25 y.o. female presenting to the [facility] Department for an initial well woman exam/family planning visit  Contraception counseling: Reviewed all forms of birth control options in the tiered based approach. available including abstinence; over the counter/barrier methods; hormonal contraceptive medication including pill, patch, ring, injection,contraceptive implant, ECP; hormonal and nonhormonal IUDs; permanent sterilization options including vasectomy and the various tubal sterilization modalities. Risks, benefits, and typical effectiveness rates were reviewed.  Questions were answered.  Written information was also given to the patient to review.  Patient desires nothing, this was prescribed for patient. She will follow up in  prn for surveillance.  She was told to call with any further questions, or with any concerns about this method of contraception.  Emphasized use of condoms 100% of the time for STI prevention.  Patient was not offered ECP . ECP was not accepted by the patient. ECP counseling was not given - see RN documentation  1. Family planning Treat wet mount per standing orders Immunization nurse consult Pt declines any birth control today  - WET PREP FOR TRICH, YEAST, CLUE - Hemoglobin, venipuncture - Chlamydia/Gonorrhea [place] - Syphilis Serology, [place] - HIV/HCV [facility] - Ambulatory referral to Behavioral Health  2. Encounter for surveillance of implantable subdermal contraceptive Inserted 09/05/2019  3. Overweight BMI=27.8  4. Nexplanon removal Nexplanon Removal Patient identified, informed consent performed, consent  signed.   Appropriate time out taken. Nexplanon site identified.  Area prepped in usual sterile fashon. 3 ml of 1% lidocaine with Epinephrine was used to anesthetize the area at the distal end of the implant and along implant site. A small stab incision was made right beside the implant on the distal portion.  The Nexplanon rod was grasped using straighthemostats/manual and removed without difficulty.  There was minimal blood loss. There were no complications.  Steri-strips were applied over the small incision.  A pressure bandage was applied to reduce any bruising.  The patient tolerated the procedure well and was given post procedure instructions.  Beatris Si, PA consulted and was needed to assist and ended up removing device Nexplanon:   Counseled patient to take OTC analgesic starting as soon as lidocaine starts to wear off and take regularly for at least 48 hr to decrease discomfort.  Specifically to take with food or milk to decrease stomach upset and  for IB 600 mg (3 tablets) every 6 hrs; IB 800 mg (4 tablets) every 8 hrs; or Aleve 2 tablets every 12 hrs.     5. Marijuana use daily      Return if symptoms worsen or fail to improve, for yearly physical exam.  No future appointments.  Alberteen Spindle, CNM

## 2020-11-27 NOTE — Progress Notes (Signed)
Here today for a PE and Nexplanon removal. Last PE and Pap Smear (NIL) here was 08/28/2019. Nexplanon inserted here 09/05/2019. Declines alternate birth control. Wants all STD screening today. Tawny Hopping, RN

## 2020-11-27 NOTE — Progress Notes (Signed)
Hgb 13.3. [employer] results reviewed by provider Hazle Coca, CNM. No further orders given. Tawny Hopping, RN

## 2021-04-14 ENCOUNTER — Ambulatory Visit (LOCAL_COMMUNITY_HEALTH_CENTER): Payer: Medicaid Other

## 2021-04-14 ENCOUNTER — Other Ambulatory Visit: Payer: Self-pay

## 2021-04-14 VITALS — BP 129/89 | Ht 61.0 in | Wt 145.5 lb

## 2021-04-14 DIAGNOSIS — Z3201 Encounter for pregnancy test, result positive: Secondary | ICD-10-CM | POA: Diagnosis not present

## 2021-04-14 LAB — PREGNANCY, URINE: Preg Test, Ur: POSITIVE — AB

## 2021-04-14 MED ORDER — PRENATAL 27-0.8 MG PO TABS: 1.0000 | ORAL_TABLET | Freq: Every day | ORAL | 0 refills | Status: AC

## 2021-04-14 NOTE — Progress Notes (Signed)
UPT positive. Plans prenatal care at [facility]. Pt reports anemia and blood transfusion last pregnancy in 2020. Consult Hazle Coca, CNM who advises RN to counsel pt about PICA and to not eat ice, cornstarch. Also, recommends to take PNV daily and to eat iron rich foods, such as eggs, green leafy vegetables. Pt encouraged to establish prenatal care soon. Pt in agreement and reports understanding. Jerel Shepherd, RN

## 2021-04-18 NOTE — Progress Notes (Signed)
Consulted on the plan of care for this client.  I agree with the documented note and actions taken to provide care for this client.  E. Jefferie Holston, CNM  

## 2022-04-29 ENCOUNTER — Emergency Department
Admission: EM | Admit: 2022-04-29 | Discharge: 2022-04-29 | Disposition: A | Discharge status: Home / Self Care | Payer: No Typology Code available for payment source | Attending: Emergency Medicine | Admitting: Emergency Medicine

## 2022-04-29 ENCOUNTER — Encounter: Payer: Self-pay | Admitting: Emergency Medicine

## 2022-04-29 ENCOUNTER — Other Ambulatory Visit: Payer: Self-pay

## 2022-04-29 ENCOUNTER — Emergency Department: Payer: No Typology Code available for payment source

## 2022-04-29 DIAGNOSIS — S60221A Contusion of right hand, initial encounter: Secondary | ICD-10-CM | POA: Diagnosis not present

## 2022-04-29 DIAGNOSIS — S60229A Contusion of unspecified hand, initial encounter: Secondary | ICD-10-CM

## 2022-04-29 DIAGNOSIS — S39012A Strain of muscle, fascia and tendon of lower back, initial encounter: Secondary | ICD-10-CM | POA: Insufficient documentation

## 2022-04-29 DIAGNOSIS — S161XXA Strain of muscle, fascia and tendon at neck level, initial encounter: Secondary | ICD-10-CM | POA: Diagnosis not present

## 2022-04-29 DIAGNOSIS — Y92410 Unspecified street and highway as the place of occurrence of the external cause: Secondary | ICD-10-CM | POA: Insufficient documentation

## 2022-04-29 DIAGNOSIS — M542 Cervicalgia: Secondary | ICD-10-CM | POA: Diagnosis present

## 2022-04-29 LAB — POC URINE PREG, ED: Preg Test, Ur: NEGATIVE

## 2022-04-29 MED ORDER — BACLOFEN 10 MG PO TABS: 10.0000 mg | ORAL_TABLET | Freq: Three times a day (TID) | ORAL | 0 refills | Status: AC

## 2022-04-29 MED ORDER — MELOXICAM 15 MG PO TABS: 15.0000 mg | ORAL_TABLET | Freq: Every day | ORAL | 2 refills | Status: AC
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
## 2022-04-29 NOTE — Discharge Instructions (Signed)
Follow-up with orthopedics if not improving in 1 week.  Return emergency department worsening.  X-rays are negative and do not show any fractures.  Use the medication as prescribed.  Apply ice to all areas that hurt.  May also take Tylenol for pain.

## 2022-09-24 ENCOUNTER — Other Ambulatory Visit: Payer: Self-pay

## 2022-09-24 ENCOUNTER — Emergency Department
Admission: EM | Admit: 2022-09-24 | Discharge: 2022-09-24 | Disposition: A | Discharge status: Home / Self Care | Payer: Medicaid Other | Attending: Emergency Medicine | Admitting: Emergency Medicine

## 2022-09-24 DIAGNOSIS — R059 Cough, unspecified: Secondary | ICD-10-CM | POA: Diagnosis present

## 2022-09-24 DIAGNOSIS — J101 Influenza due to other identified influenza virus with other respiratory manifestations: Secondary | ICD-10-CM | POA: Diagnosis not present

## 2022-09-24 DIAGNOSIS — Z20822 Contact with and (suspected) exposure to covid-19: Secondary | ICD-10-CM | POA: Insufficient documentation

## 2022-09-24 LAB — RESP PANEL BY RT-PCR (RSV, FLU A&B, COVID)  RVPGX2
Influenza A by PCR: POSITIVE — AB
Influenza B by PCR: NEGATIVE
Resp Syncytial Virus by PCR: NEGATIVE
SARS Coronavirus 2 by RT PCR: NEGATIVE

## 2022-09-24 MED ORDER — OSELTAMIVIR PHOSPHATE 75 MG PO CAPS: 75.0000 mg | ORAL_CAPSULE | Freq: Two times a day (BID) | ORAL | 0 refills | Status: DC

## 2022-09-24 NOTE — Discharge Instructions (Signed)
Start Tamiflu as prescribed. Alternate Motrin and Tylenol every 4 hours as needed for fever greater than 100.18F. Return to the ER for worsening symptoms, persistent vomiting, difficulty breathing or other concerns.

## 2022-09-24 NOTE — ED Provider Notes (Signed)
[facility] Provider Note    Event Date/Time   First MD Initiated Contact with Patient 09/24/22 0430     (approximate)   History   Chills   HPI  Kristy [name] is a 26 y.o. female who presents to the ED from home along with her son who has similar symptoms with a 1 day chief complaint of dry cough, sore throat, body aches and chills.  Sick contacts at home.  Denies chest pain, shortness of breath, abdominal pain, nausea, vomiting or dizziness.     Past Medical History   Past Medical History:  Diagnosis Date   Anemia affecting pregnancy    Cholelithiasis    History of migraine during pregnancy    Marijuana abuse    Mental disorder    "feels anxious and depressed"     Active Problem List   Patient Active Problem List   Diagnosis Date Noted   Overweight BMI=27.8 11/27/2020   Marijuana use daily 11/27/2020     Past Surgical History   Past Surgical History:  Procedure Laterality Date   CHOLECYSTECTOMY     CHOLECYSTECTOMY, LAPAROSCOPIC  Feb 2016     Home Medications   Prior to Admission medications   Medication Sig Start Date End Date Taking? Authorizing Provider  oseltamivir (TAMIFLU) 75 MG capsule Take 1 capsule (75 mg total) by mouth 2 (two) times daily. 09/24/22  Yes Irean Hong, MD  meloxicam (MOBIC) 15 MG tablet Take 1 tablet (15 mg total) by mouth daily. 04/29/22 04/29/23  Faythe Ghee, PA-C     Allergies  Patient has no known allergies.   Family History   Family History  Problem Relation Age of Onset   Depression Maternal Grandmother    Cancer Paternal Grandmother    Cancer Paternal Grandfather      Physical Exam  Triage Vital Signs: ED Triage Vitals  Enc Vitals Group     BP 09/24/22 0048 (!) 115/98     Pulse Rate 09/24/22 0048 100     Resp 09/24/22 0048 17     Temp 09/24/22 0048 98.2 F (36.8 C)     Temp Source 09/24/22 0048 Oral     SpO2 09/24/22 0048 100 %     Weight 09/24/22 0051 157 lb (71.2 kg)      Height 09/24/22 0051 5\' 1"  (1.549 m)     Head Circumference --      Peak Flow --      Pain Score 09/24/22 0037 0     Pain Loc --      Pain Edu? --      Excl. in GC? --     Updated Vital Signs: BP 111/89 (BP Location: Left Arm)   Pulse 89   Temp 98.2 F (36.8 C)   Resp 20   Ht 5\' 1"  (1.549 m)   Wt 71.2 kg   LMP 08/28/2022 (Approximate)   SpO2 98%   BMI 29.66 kg/m    General: Awake, no distress.  CV:  RRR. Good peripheral perfusion.  Resp:  Normal effort. CTAB. Abd:  Nontender. No distention.  Other:  Oropharynx minimally erythematous without tonsillar swelling, exudates or peritonsillar abscess. There is no hoarse or muffled voice. There is no drooling. No cervical LAD.   ED Results / Procedures / Treatments  Labs (all labs ordered are listed, but only abnormal results are displayed) Labs Reviewed  RESP PANEL BY RT-PCR (RSV, FLU A&B, COVID)  [id number] - Abnormal; Notable for  the following components:      Result Value   Influenza A by PCR POSITIVE (*)    All other components within normal limits     EKG  None   RADIOLOGY None   Official radiology report(s): No results found.   PROCEDURES:  Critical Care performed: No  Procedures   MEDICATIONS ORDERED IN ED: Medications - No data to display   IMPRESSION / MDM / ASSESSMENT AND PLAN / ED COURSE  I reviewed the triage vital signs and the nursing notes.                             25 year old female presenting with cold symptoms. She is Influenza A+. Will treat with Tamiflu. Strict return precautions given. Patient verbalizes understanding and agrees with plan of care.  Patient's presentation is most consistent with acute, uncomplicated illness.   FINAL CLINICAL IMPRESSION(S) / ED DIAGNOSES   Final diagnoses:  Influenza A     Rx / DC Orders   ED Discharge Orders          Ordered    oseltamivir (TAMIFLU) 75 MG capsule  2 times daily        09/24/22 0435             Note:  This  document was prepared using Dragon voice recognition software and may include unintentional dictation errors.   Irean Hong, MD 09/24/22 [phone]

## 2022-09-24 NOTE — ED Triage Notes (Signed)
Patient ambulatory to triage with steady gait, without difficulty or distress noted; pt reports having chills, bodyaches today and concerned that she has the flu; here with son also to be seen for same symptoms; no meds taken PTA

## 2022-09-24 NOTE — ED Notes (Signed)
E-signature pad unavailable - Pt verbalized understanding of D/C information - no additional concerns at this time.  

## 2024-09-07 ENCOUNTER — Encounter: Payer: Self-pay | Admitting: Emergency Medicine

## 2024-09-07 ENCOUNTER — Ambulatory Visit
Admission: EM | Admit: 2024-09-07 | Discharge: 2024-09-07 | Disposition: A | Discharge status: Home / Self Care | Attending: Emergency Medicine | Admitting: Emergency Medicine

## 2024-09-07 DIAGNOSIS — B349 Viral infection, unspecified: Secondary | ICD-10-CM | POA: Diagnosis not present

## 2024-09-07 DIAGNOSIS — J101 Influenza due to other identified influenza virus with other respiratory manifestations: Secondary | ICD-10-CM | POA: Diagnosis not present

## 2024-09-07 LAB — POC COVID19/FLU A&B COMBO
Covid Antigen, POC: NEGATIVE
Influenza A Antigen, POC: NEGATIVE
Influenza B Antigen, POC: POSITIVE — AB

## 2024-09-07 MED ORDER — PROMETHAZINE-DM 6.25-15 MG/5ML PO SYRP: 5.0000 mL | ORAL_SOLUTION | Freq: Four times a day (QID) | ORAL | 0 refills | Status: AC | PRN

## 2024-09-07 MED ORDER — BENZONATATE 100 MG PO CAPS: 100.0000 mg | ORAL_CAPSULE | Freq: Three times a day (TID) | ORAL | 0 refills | Status: AC

## 2024-09-07 NOTE — ED Provider Notes (Signed)
 Kristy [name]    CSN: [id number] Arrival date & time: 09/07/24  9176      History   Chief Complaint Chief Complaint  Patient presents with   Cough   Nasal Congestion   Chills   Fatigue    HPI Kristy [name] is a 28 y.o. female.   Patient presents for evaluation of increased fatigue, chills, nasal congestion, rhinorrhea, left-sided ear pain, sore throat, a productive cough with clear sputum, centralized chest pain and diarrhea present for 4 days.  Has experienced sinus pressure affecting the teeth.  Known sick contacts in household.  Poor appetite due to alterations in taste.  Has attempted NyQuil and Alka-Seltzer today at night.  Denies respiratory history.    Past Medical History:  Diagnosis Date   Anemia affecting pregnancy    Cholelithiasis    History of migraine during pregnancy    Marijuana abuse    Mental disorder    feels anxious and depressed    Patient Active Problem List   Diagnosis Date Noted   Overweight BMI=27.8 11/27/2020   Marijuana use daily 11/27/2020    Past Surgical History:  Procedure Laterality Date   CHOLECYSTECTOMY     CHOLECYSTECTOMY, LAPAROSCOPIC  Feb 2016    OB History     Gravida  5   Para  3   Term  3   Preterm  0   AB  1   Living  3      SAB  0   IAB  0   Ectopic  0   Multiple  0   Live Births  3            Home Medications    Prior to Admission medications  Medication Sig Start Date End Date Taking? Authorizing Provider  oseltamivir  (TAMIFLU ) 75 MG capsule Take 1 capsule (75 mg total) by mouth 2 (two) times daily. 09/24/22   Robinette Vermell PARAS, MD    Family History Family History  Problem Relation Age of Onset   Depression Maternal Grandmother    Cancer Paternal Grandmother    Cancer Paternal Grandfather     Social History Social History[1]   Allergies   Patient has no known allergies.   Review of Systems Review of Systems  Constitutional:  Positive for chills and fatigue.  Negative for activity change, appetite change, diaphoresis, fever and unexpected weight change.  HENT:  Positive for congestion, ear pain, rhinorrhea, sinus pressure and sore throat. Negative for dental problem, drooling, ear discharge, facial swelling, hearing loss, mouth sores, nosebleeds, postnasal drip, sinus pain, sneezing, tinnitus, trouble swallowing and voice change.   Respiratory:  Positive for cough. Negative for apnea, choking, chest tightness, shortness of breath, wheezing and stridor.   Gastrointestinal:  Positive for diarrhea. Negative for abdominal distention, abdominal pain, anal bleeding, blood in stool, constipation, nausea, rectal pain and vomiting.     Physical Exam Triage Vital Signs ED Triage Vitals  Encounter Vitals Group     BP 09/07/24 0857 132/87     Girls Systolic BP Percentile --      Girls Diastolic BP Percentile --      Boys Systolic BP Percentile --      Boys Diastolic BP Percentile --      Pulse Rate 09/07/24 0857 (!) 101     Resp 09/07/24 0857 20     Temp 09/07/24 0857 98.5 F (36.9 C)     Temp Source 09/07/24 0857 Oral     SpO2 09/07/24  0857 100 %     Weight --      Height --      Head Circumference --      Peak Flow --      Pain Score 09/07/24 0901 0     Pain Loc --      Pain Education --      Exclude from Growth Chart --    No data found.  Updated Vital Signs BP 132/87 (BP Location: Left Arm)   Pulse (!) 101   Temp 98.5 F (36.9 C) (Oral)   Resp 20   LMP 08/15/2024 (Exact Date)   SpO2 100%   Breastfeeding No   Visual Acuity Right Eye Distance:   Left Eye Distance:   Bilateral Distance:    Right Eye Near:   Left Eye Near:    Bilateral Near:     Physical Exam Constitutional:      Appearance: Normal appearance.  HENT:     Right Ear: Tympanic membrane, ear canal and external ear normal.     Left Ear: Tympanic membrane, ear canal and external ear normal.     Nose: Congestion present.     Mouth/Throat:     Pharynx: No  oropharyngeal exudate or posterior oropharyngeal erythema.  Cardiovascular:     Rate and Rhythm: Normal rate and regular rhythm.     Pulses: Normal pulses.     Heart sounds: Normal heart sounds.  Pulmonary:     Effort: Pulmonary effort is normal.     Breath sounds: Normal breath sounds.  Neurological:     Mental Status: She is alert.      UC Treatments / Results  Labs (all labs ordered are listed, but only abnormal results are displayed) Labs Reviewed  POC COVID19/FLU A&B COMBO    EKG   Radiology No results found.  Procedures Procedures (including critical care time)  Medications Ordered in UC Medications - No data to display  Initial Impression / Assessment and Plan / UC Course  I have reviewed the triage vital signs and the nursing notes.  Pertinent labs & imaging results that were available during my care of the patient were reviewed by me and considered in my medical decision making (see chart for details).  Influenza B , viral illness  Patient is in no signs of distress nor toxic appearing.  Vital signs are stable.  Low suspicion for pneumonia, pneumothorax or bronchitis and therefore will defer imaging.  COVID testing negative.  Day 4 of illness past window for Tamiflu , discussed, prescribed Tessalon and Promethazine DM for management.  Has discomfort most consistent with muscular soreness, discussed with patient.  May use additional over-the-counter medications as needed for supportive care.  May follow-up with urgent care as needed if symptoms persist or worsen.  Note given.   Final Clinical Impressions(s) / UC Diagnoses   Final diagnoses:  Viral illness   Discharge Instructions   None    ED Prescriptions   None    PDMP not reviewed this encounter.     [1]  Social History Tobacco Use   Smoking status: Former    Types: Cigars   Smokeless tobacco: Never  Vaping Use   Vaping status: Never Used  Substance Use Topics   Alcohol use: Not Currently     Alcohol/week: 1.0 standard drink of alcohol    Types: 1 Standard drinks or equivalent per week    Comment: last use 11/23/20   Drug use: Yes    Types: Marijuana  Comment: Last use  mj- 09-03-24     Teresa Shelba SAUNDERS, NP 09/07/24 1031

## 2024-09-07 NOTE — Discharge Instructions (Signed)
They are most likely being caused by influenza A, your symptoms are consistent with the current presentation, influenza is a virus and will steadily improve with time  You may take Tamiflu twice daily for the next 5 days, if you are able to get this medication from the pharmacy then continue supportive treatment, symptoms will improve as the virus works as well after system whether or not you take this medication    You can take Tylenol and/or Ibuprofen as needed for fever reduction and pain relief.   For cough: honey 1/2 to 1 teaspoon (you can dilute the honey in water or another fluid).  You can also use guaifenesin and dextromethorphan for cough. You can use a humidifier for chest congestion and cough.  If you don't have a humidifier, you can sit in the bathroom with the hot shower running.      For sore throat: try warm salt water gargles, cepacol lozenges, throat spray, warm tea or water with lemon/honey, popsicles or ice, or OTC cold relief medicine for throat discomfort.   For congestion: take a daily anti-histamine like Zyrtec, Claritin, and a oral decongestant, such as pseudoephedrine.  You can also use Flonase 1-2 sprays in each nostril daily.   It is important to stay hydrated: drink plenty of fluids (water, gatorade/powerade/pedialyte, juices, or teas) to keep your throat moisturized and help further relieve irritation/discomfort.    

## 2024-09-07 NOTE — ED Triage Notes (Signed)
 Patient complains chills, productive cough with clear mucus, chest congestion, nasal congestion, and fatigue x 4 days. Patient has been taking Nyquil and Alka-seltzer day and night with mild relief.
